# Patient Record
Sex: Male | Born: 1945 | Race: White | Hispanic: No | Marital: Married | Smoking: Never smoker
Health system: Southern US, Community
[De-identification: ages and names within clinical notes are randomized; demographics above are authoritative.]

## PROBLEM LIST (undated history)

## (undated) DIAGNOSIS — R030 Elevated blood-pressure reading, without diagnosis of hypertension: Secondary | ICD-10-CM

## (undated) DIAGNOSIS — I10 Essential (primary) hypertension: Secondary | ICD-10-CM

## (undated) DIAGNOSIS — E785 Hyperlipidemia, unspecified: Secondary | ICD-10-CM

## (undated) DIAGNOSIS — K209 Esophagitis, unspecified without bleeding: Secondary | ICD-10-CM

## (undated) DIAGNOSIS — C61 Malignant neoplasm of prostate: Secondary | ICD-10-CM

## (undated) DIAGNOSIS — K449 Diaphragmatic hernia without obstruction or gangrene: Secondary | ICD-10-CM

## (undated) DIAGNOSIS — K222 Esophageal obstruction: Secondary | ICD-10-CM

## (undated) DIAGNOSIS — K2270 Barrett's esophagus without dysplasia: Secondary | ICD-10-CM

## (undated) DIAGNOSIS — I447 Left bundle-branch block, unspecified: Secondary | ICD-10-CM

## (undated) DIAGNOSIS — D649 Anemia, unspecified: Secondary | ICD-10-CM

## (undated) DIAGNOSIS — K219 Gastro-esophageal reflux disease without esophagitis: Secondary | ICD-10-CM

## (undated) DIAGNOSIS — K2900 Acute gastritis without bleeding: Secondary | ICD-10-CM

## (undated) DIAGNOSIS — Z86010 Personal history of colonic polyps: Secondary | ICD-10-CM

## (undated) DIAGNOSIS — C189 Malignant neoplasm of colon, unspecified: Secondary | ICD-10-CM

## (undated) HISTORY — DX: Malignant neoplasm of colon, unspecified: C18.9

## (undated) HISTORY — DX: Left bundle-branch block, unspecified: I44.7

## (undated) HISTORY — DX: Esophagitis, unspecified: K20.9

## (undated) HISTORY — DX: Malignant neoplasm of prostate: C61

## (undated) HISTORY — DX: Elevated blood-pressure reading, without diagnosis of hypertension: R03.0

## (undated) HISTORY — DX: Esophageal obstruction: K22.2

## (undated) HISTORY — DX: Acute gastritis without bleeding: K29.00

## (undated) HISTORY — DX: Gastro-esophageal reflux disease without esophagitis: K21.9

## (undated) HISTORY — DX: Essential (primary) hypertension: I10

## (undated) HISTORY — DX: Hyperlipidemia, unspecified: E78.5

## (undated) HISTORY — DX: Barrett's esophagus without dysplasia: K22.70

## (undated) HISTORY — PX: ESOPHAGEAL DILATION: SHX303

## (undated) HISTORY — PX: OTHER SURGICAL HISTORY: SHX169

## (undated) HISTORY — DX: Anemia, unspecified: D64.9

## (undated) HISTORY — DX: Esophagitis, unspecified without bleeding: K20.90

## (undated) HISTORY — DX: Personal history of colonic polyps: Z86.010

## (undated) HISTORY — DX: Diaphragmatic hernia without obstruction or gangrene: K44.9

---

## 1998-08-17 ENCOUNTER — Encounter: Payer: Self-pay | Admitting: Emergency Medicine

## 1998-08-17 ENCOUNTER — Emergency Department (HOSPITAL_COMMUNITY): Admission: EM | Admit: 1998-08-17 | Discharge: 1998-08-17 | Payer: Self-pay | Admitting: Emergency Medicine

## 1998-09-02 ENCOUNTER — Ambulatory Visit (HOSPITAL_BASED_OUTPATIENT_CLINIC_OR_DEPARTMENT_OTHER): Admission: RE | Admit: 1998-09-02 | Discharge: 1998-09-02 | Payer: Self-pay | Admitting: Otolaryngology

## 1999-02-01 ENCOUNTER — Other Ambulatory Visit: Admission: RE | Admit: 1999-02-01 | Discharge: 1999-02-01 | Payer: Self-pay | Admitting: Gastroenterology

## 1999-02-01 ENCOUNTER — Encounter (INDEPENDENT_AMBULATORY_CARE_PROVIDER_SITE_OTHER): Payer: Self-pay | Admitting: Specialist

## 2000-03-13 ENCOUNTER — Ambulatory Visit (HOSPITAL_COMMUNITY): Admission: RE | Admit: 2000-03-13 | Discharge: 2000-03-13 | Payer: Self-pay | Admitting: *Deleted

## 2000-03-13 ENCOUNTER — Encounter: Payer: Self-pay | Admitting: *Deleted

## 2000-03-17 ENCOUNTER — Ambulatory Visit (HOSPITAL_COMMUNITY): Admission: RE | Admit: 2000-03-17 | Discharge: 2000-03-17 | Payer: Self-pay | Admitting: *Deleted

## 2000-03-17 ENCOUNTER — Encounter: Payer: Self-pay | Admitting: *Deleted

## 2000-12-25 ENCOUNTER — Encounter: Payer: Self-pay | Admitting: Family Medicine

## 2000-12-25 ENCOUNTER — Ambulatory Visit (HOSPITAL_COMMUNITY): Admission: RE | Admit: 2000-12-25 | Discharge: 2000-12-25 | Payer: Self-pay | Admitting: Family Medicine

## 2001-06-29 ENCOUNTER — Encounter: Payer: Self-pay | Admitting: Internal Medicine

## 2002-01-24 HISTORY — PX: CARDIAC CATHETERIZATION: SHX172

## 2002-03-11 ENCOUNTER — Ambulatory Visit (HOSPITAL_COMMUNITY): Admission: RE | Admit: 2002-03-11 | Discharge: 2002-03-11 | Payer: Self-pay | Admitting: Cardiology

## 2003-04-02 ENCOUNTER — Encounter: Payer: Self-pay | Admitting: Internal Medicine

## 2004-06-16 ENCOUNTER — Ambulatory Visit: Payer: Self-pay | Admitting: Internal Medicine

## 2004-07-14 ENCOUNTER — Ambulatory Visit: Payer: Self-pay | Admitting: Internal Medicine

## 2004-11-02 ENCOUNTER — Ambulatory Visit: Payer: Self-pay | Admitting: Gastroenterology

## 2004-12-06 ENCOUNTER — Encounter (INDEPENDENT_AMBULATORY_CARE_PROVIDER_SITE_OTHER): Payer: Self-pay | Admitting: Specialist

## 2004-12-06 ENCOUNTER — Ambulatory Visit: Payer: Self-pay | Admitting: Gastroenterology

## 2005-01-04 ENCOUNTER — Ambulatory Visit: Payer: Self-pay | Admitting: Gastroenterology

## 2005-01-24 HISTORY — PX: PROSTATECTOMY: SHX69

## 2005-02-11 ENCOUNTER — Ambulatory Visit: Payer: Self-pay | Admitting: Gastroenterology

## 2005-02-11 ENCOUNTER — Encounter (INDEPENDENT_AMBULATORY_CARE_PROVIDER_SITE_OTHER): Payer: Self-pay | Admitting: *Deleted

## 2005-08-31 ENCOUNTER — Ambulatory Visit: Payer: Self-pay | Admitting: Internal Medicine

## 2005-09-28 ENCOUNTER — Ambulatory Visit: Payer: Self-pay | Admitting: Internal Medicine

## 2006-01-24 DIAGNOSIS — C61 Malignant neoplasm of prostate: Secondary | ICD-10-CM

## 2006-01-24 HISTORY — DX: Malignant neoplasm of prostate: C61

## 2006-05-29 DIAGNOSIS — K219 Gastro-esophageal reflux disease without esophagitis: Secondary | ICD-10-CM

## 2006-05-29 DIAGNOSIS — I447 Left bundle-branch block, unspecified: Secondary | ICD-10-CM

## 2006-05-30 ENCOUNTER — Ambulatory Visit: Payer: Self-pay | Admitting: Internal Medicine

## 2006-05-30 LAB — CONVERTED CEMR LAB
ALT: 30 units/L (ref 0–40)
AST: 27 units/L (ref 0–37)
Cholesterol: 162 mg/dL (ref 0–200)
HDL: 42.2 mg/dL (ref >39.0)
Hgb A1c MFr Bld: 5.2 % (ref 4.6–6.0)
LDL Cholesterol: 100 mg/dL — ABNORMAL HIGH (ref 0–99)
PSA: 2.7 ng/mL (ref 0.10–4.00)
Total CHOL/HDL Ratio: 3.8
Triglycerides: 98 mg/dL (ref 0–149)
VLDL: 20 mg/dL (ref 0–40)

## 2006-10-17 ENCOUNTER — Telehealth: Payer: Self-pay | Admitting: Internal Medicine

## 2006-11-14 ENCOUNTER — Encounter: Payer: Self-pay | Admitting: Internal Medicine

## 2006-11-17 ENCOUNTER — Ambulatory Visit: Payer: Self-pay | Admitting: Gastroenterology

## 2006-11-20 ENCOUNTER — Encounter: Payer: Self-pay | Admitting: Internal Medicine

## 2006-11-20 ENCOUNTER — Ambulatory Visit: Payer: Self-pay | Admitting: Gastroenterology

## 2006-11-20 ENCOUNTER — Encounter: Payer: Self-pay | Admitting: Gastroenterology

## 2006-11-29 ENCOUNTER — Encounter: Payer: Self-pay | Admitting: Internal Medicine

## 2006-12-12 ENCOUNTER — Encounter: Payer: Self-pay | Admitting: Internal Medicine

## 2007-01-09 ENCOUNTER — Encounter: Payer: Self-pay | Admitting: Internal Medicine

## 2007-01-25 HISTORY — PX: INGUINAL HERNIA REPAIR: SHX194

## 2007-03-22 DIAGNOSIS — K5730 Diverticulosis of large intestine without perforation or abscess without bleeding: Secondary | ICD-10-CM | POA: Insufficient documentation

## 2007-03-22 DIAGNOSIS — K449 Diaphragmatic hernia without obstruction or gangrene: Secondary | ICD-10-CM | POA: Insufficient documentation

## 2007-03-22 DIAGNOSIS — D131 Benign neoplasm of stomach: Secondary | ICD-10-CM

## 2007-03-22 DIAGNOSIS — E785 Hyperlipidemia, unspecified: Secondary | ICD-10-CM

## 2007-03-22 DIAGNOSIS — K222 Esophageal obstruction: Secondary | ICD-10-CM | POA: Insufficient documentation

## 2007-03-22 DIAGNOSIS — K2270 Barrett's esophagus without dysplasia: Secondary | ICD-10-CM

## 2007-09-14 ENCOUNTER — Telehealth: Payer: Self-pay | Admitting: Gastroenterology

## 2007-10-15 ENCOUNTER — Emergency Department (HOSPITAL_COMMUNITY): Admission: EM | Admit: 2007-10-15 | Discharge: 2007-10-15 | Payer: Self-pay | Admitting: Emergency Medicine

## 2007-11-09 ENCOUNTER — Ambulatory Visit: Payer: Self-pay | Admitting: Internal Medicine

## 2007-11-09 DIAGNOSIS — Z8546 Personal history of malignant neoplasm of prostate: Secondary | ICD-10-CM

## 2007-11-09 DIAGNOSIS — Z860100 Personal history of colon polyps, unspecified: Secondary | ICD-10-CM | POA: Insufficient documentation

## 2007-11-19 ENCOUNTER — Encounter (INDEPENDENT_AMBULATORY_CARE_PROVIDER_SITE_OTHER): Payer: Self-pay | Admitting: *Deleted

## 2007-11-20 ENCOUNTER — Ambulatory Visit: Payer: Self-pay | Admitting: Gastroenterology

## 2007-12-03 ENCOUNTER — Encounter: Payer: Self-pay | Admitting: Gastroenterology

## 2007-12-03 ENCOUNTER — Ambulatory Visit: Payer: Self-pay | Admitting: Gastroenterology

## 2007-12-04 ENCOUNTER — Encounter: Payer: Self-pay | Admitting: Gastroenterology

## 2008-03-20 ENCOUNTER — Encounter: Payer: Self-pay | Admitting: Internal Medicine

## 2008-06-02 ENCOUNTER — Ambulatory Visit: Payer: Self-pay | Admitting: Internal Medicine

## 2008-06-02 DIAGNOSIS — I10 Essential (primary) hypertension: Secondary | ICD-10-CM

## 2008-06-03 ENCOUNTER — Ambulatory Visit: Payer: Self-pay | Admitting: Gastroenterology

## 2008-06-04 ENCOUNTER — Telehealth (INDEPENDENT_AMBULATORY_CARE_PROVIDER_SITE_OTHER): Payer: Self-pay | Admitting: *Deleted

## 2008-06-04 ENCOUNTER — Encounter (INDEPENDENT_AMBULATORY_CARE_PROVIDER_SITE_OTHER): Payer: Self-pay | Admitting: *Deleted

## 2008-08-07 ENCOUNTER — Ambulatory Visit: Payer: Self-pay | Admitting: Internal Medicine

## 2008-08-15 ENCOUNTER — Encounter (INDEPENDENT_AMBULATORY_CARE_PROVIDER_SITE_OTHER): Payer: Self-pay | Admitting: *Deleted

## 2008-08-15 LAB — CONVERTED CEMR LAB
Bilirubin, Direct: 0 mg/dL (ref 0.0–0.3)
Cholesterol: 191 mg/dL (ref 0–200)
HDL: 38.7 mg/dL — ABNORMAL LOW (ref >39.00)
LDL Cholesterol: 127 mg/dL — ABNORMAL HIGH (ref 0–99)
Total Bilirubin: 1.4 mg/dL — ABNORMAL HIGH (ref 0.3–1.2)
Total Protein: 6.9 g/dL (ref 6.0–8.3)
VLDL: 25.6 mg/dL (ref 0.0–40.0)

## 2009-02-16 ENCOUNTER — Ambulatory Visit: Payer: Self-pay | Admitting: Internal Medicine

## 2009-02-18 LAB — CONVERTED CEMR LAB
AST: 20 units/L (ref 0–37)
Albumin: 4.5 g/dL (ref 3.5–5.2)
Alkaline Phosphatase: 47 units/L (ref 39–117)
Cholesterol: 182 mg/dL (ref 0–200)
HDL: 41.9 mg/dL (ref >39.00)
Total CHOL/HDL Ratio: 4
Total Protein: 7.1 g/dL (ref 6.0–8.3)
Triglycerides: 117 mg/dL (ref 0.0–149.0)

## 2009-03-17 ENCOUNTER — Encounter: Payer: Self-pay | Admitting: Internal Medicine

## 2009-03-25 ENCOUNTER — Encounter: Payer: Self-pay | Admitting: Internal Medicine

## 2009-04-08 ENCOUNTER — Encounter: Payer: Self-pay | Admitting: Internal Medicine

## 2009-06-16 ENCOUNTER — Ambulatory Visit: Payer: Self-pay | Admitting: Internal Medicine

## 2009-06-25 ENCOUNTER — Ambulatory Visit: Payer: Self-pay | Admitting: Internal Medicine

## 2009-07-02 LAB — CONVERTED CEMR LAB
Alkaline Phosphatase: 48 units/L (ref 39–117)
Basophils Absolute: 0 10*3/uL (ref 0.0–0.1)
Bilirubin, Direct: 0.1 mg/dL (ref 0.0–0.3)
CO2: 31 meq/L (ref 19–32)
Calcium: 9.6 mg/dL (ref 8.4–10.5)
Creatinine, Ser: 1.2 mg/dL (ref 0.4–1.5)
Eosinophils Absolute: 0.3 10*3/uL (ref 0.0–0.7)
GFR calc non Af Amer: 65.92 mL/min (ref >60)
HDL: 44.8 mg/dL (ref >39.00)
Hemoglobin: 14.8 g/dL (ref 13.0–17.0)
LDL Cholesterol: 112 mg/dL — ABNORMAL HIGH (ref 0–99)
Lymphocytes Relative: 22.7 % (ref 12.0–46.0)
MCHC: 34.3 g/dL (ref 30.0–36.0)
Monocytes Relative: 8.1 % (ref 3.0–12.0)
Neutro Abs: 5.4 10*3/uL (ref 1.4–7.7)
Neutrophils Relative %: 65.7 % (ref 43.0–77.0)
Platelets: 298 10*3/uL (ref 150.0–400.0)
RDW: 13.7 % (ref 11.5–14.6)
Sodium: 144 meq/L (ref 135–145)
TSH: 1.95 microintl units/mL (ref 0.35–5.50)
Total Bilirubin: 0.7 mg/dL (ref 0.3–1.2)
Total CHOL/HDL Ratio: 4
Total Protein: 6.7 g/dL (ref 6.0–8.3)
Triglycerides: 147 mg/dL (ref 0.0–149.0)
VLDL: 29.4 mg/dL (ref 0.0–40.0)

## 2009-07-03 LAB — CONVERTED CEMR LAB: Hgb A1c MFr Bld: 5.3 % (ref 4.6–6.5)

## 2009-10-13 ENCOUNTER — Encounter: Payer: Self-pay | Admitting: Internal Medicine

## 2009-11-10 ENCOUNTER — Encounter (INDEPENDENT_AMBULATORY_CARE_PROVIDER_SITE_OTHER): Payer: Self-pay | Admitting: *Deleted

## 2009-12-02 ENCOUNTER — Telehealth: Payer: Self-pay | Admitting: Gastroenterology

## 2009-12-24 ENCOUNTER — Encounter (INDEPENDENT_AMBULATORY_CARE_PROVIDER_SITE_OTHER): Payer: Self-pay | Admitting: *Deleted

## 2009-12-25 ENCOUNTER — Telehealth (INDEPENDENT_AMBULATORY_CARE_PROVIDER_SITE_OTHER): Payer: Self-pay | Admitting: *Deleted

## 2010-02-02 ENCOUNTER — Encounter (INDEPENDENT_AMBULATORY_CARE_PROVIDER_SITE_OTHER): Payer: Self-pay | Admitting: *Deleted

## 2010-02-03 ENCOUNTER — Ambulatory Visit
Admission: RE | Admit: 2010-02-03 | Discharge: 2010-02-03 | Payer: Self-pay | Source: Home / Self Care | Attending: Gastroenterology | Admitting: Gastroenterology

## 2010-02-17 ENCOUNTER — Ambulatory Visit
Admission: RE | Admit: 2010-02-17 | Discharge: 2010-02-17 | Payer: Self-pay | Source: Home / Self Care | Attending: Gastroenterology | Admitting: Gastroenterology

## 2010-02-17 ENCOUNTER — Other Ambulatory Visit: Payer: Self-pay | Admitting: Gastroenterology

## 2010-02-17 DIAGNOSIS — K2900 Acute gastritis without bleeding: Secondary | ICD-10-CM | POA: Insufficient documentation

## 2010-02-17 DIAGNOSIS — K209 Esophagitis, unspecified: Secondary | ICD-10-CM

## 2010-02-17 DIAGNOSIS — D126 Benign neoplasm of colon, unspecified: Secondary | ICD-10-CM

## 2010-02-18 LAB — HELICOBACTER PYLORI SCREEN-BIOPSY: UREASE: NEGATIVE

## 2010-02-19 ENCOUNTER — Encounter: Payer: Self-pay | Admitting: Gastroenterology

## 2010-02-21 LAB — CONVERTED CEMR LAB
ALT: 25 units/L (ref 0–53)
AST: 33 units/L (ref 0–37)
Albumin: 4.5 g/dL (ref 3.5–5.2)
Alkaline Phosphatase: 55 units/L (ref 39–117)
Alkaline Phosphatase: 57 units/L (ref 39–117)
BUN: 19 mg/dL (ref 6–23)
Basophils Relative: 0.8 % (ref 0.0–3.0)
Bilirubin Urine: NEGATIVE
Bilirubin, Direct: 0 mg/dL (ref 0.0–0.3)
Calcium: 9.3 mg/dL (ref 8.4–10.5)
Chloride: 103 meq/L (ref 96–112)
Chloride: 108 meq/L (ref 96–112)
Cholesterol: 219 mg/dL — ABNORMAL HIGH (ref 0–200)
Creatinine, Ser: 1 mg/dL (ref 0.4–1.5)
Eosinophils Absolute: 0.3 10*3/uL (ref 0.0–0.7)
Eosinophils Relative: 3.2 % (ref 0.0–5.0)
Eosinophils Relative: 4.1 % (ref 0.0–5.0)
GFR calc non Af Amer: 73 mL/min
GFR calc non Af Amer: 80.86 mL/min (ref >60)
Glucose, Bld: 85 mg/dL (ref 70–99)
HDL: 34.8 mg/dL — ABNORMAL LOW (ref >39.0)
HDL: 36.9 mg/dL — ABNORMAL LOW (ref >39.00)
Lymphocytes Relative: 24.5 % (ref 12.0–46.0)
Lymphocytes Relative: 26.9 % (ref 12.0–46.0)
Monocytes Relative: 8 % (ref 3.0–12.0)
Monocytes Relative: 9.5 % (ref 3.0–12.0)
Neutrophils Relative %: 60.9 % (ref 43.0–77.0)
Neutrophils Relative %: 62 % (ref 43.0–77.0)
Platelets: 235 10*3/uL (ref 150–400)
Potassium: 4.5 meq/L (ref 3.5–5.1)
Protein, U semiquant: NEGATIVE
RBC: 4.72 M/uL (ref 4.22–5.81)
Total Bilirubin: 1 mg/dL (ref 0.3–1.2)
Total CHOL/HDL Ratio: 5.8
Total CHOL/HDL Ratio: 6
Total Protein: 6.9 g/dL (ref 6.0–8.3)
Triglycerides: 118 mg/dL (ref 0.0–149.0)
Triglycerides: 186 mg/dL — ABNORMAL HIGH (ref 0–149)
Urobilinogen, UA: 0.2
VLDL: 23.6 mg/dL (ref 0.0–40.0)
WBC: 5.9 10*3/uL (ref 4.5–10.5)
WBC: 7.2 10*3/uL (ref 4.5–10.5)

## 2010-02-22 DIAGNOSIS — Z86010 Personal history of colonic polyps: Secondary | ICD-10-CM

## 2010-02-22 HISTORY — DX: Personal history of colonic polyps: Z86.010

## 2010-02-23 ENCOUNTER — Encounter: Payer: Self-pay | Admitting: Gastroenterology

## 2010-02-25 NOTE — Miscellaneous (Signed)
Summary: [facility]  Clinical Lists Changes  Medications: Added new medication of MOVIPREP 100 GM  SOLR (PEG-KCL-NACL-NASULF-NA ASC-C) As per prep instructions. - Signed Rx of MOVIPREP 100 GM  SOLR (PEG-KCL-NACL-NASULF-NA ASC-C) As per prep instructions.;  #1 x 0;  Signed;  Entered by: [name] RN;  Authorized by: Mardella Layman MD [facility];  Method used: Electronically to [facility] [address] # [id number]*, [address] [place], [place], [place]  [address], Ph: [phone] or [phone], Fax: [phone] Observations: Added new observation of NKA: T (02/03/2010 7:58)    Prescriptions: MOVIPREP 100 GM  SOLR (PEG-KCL-NACL-NASULF-NA ASC-C) As per prep instructions.  #1 x 0   Entered by:   [name] RN   Authorized by:   Mardella Layman MD [facility]   Signed by:   [name] RN on 02/03/2010   Method used:   Electronically to        [facility] [address] [id number]* (retail)       [address]       [place], [place]  [address]       Ph: [phone] or [phone]       Fax: [phone]   RxID:   [id number]

## 2010-02-25 NOTE — Letter (Signed)
Summary: Pre Visit Letter Revised  [facility]  [address] [place], [place] [address]   Phone: [phone]  Fax: [phone]        12/24/2009 MRN: [id number] Steven Hawkins [address] [place], [place]  [address]             Procedure Date:  02/17/2010   Welcome to the Gastroenterology Division at [facility].    You are scheduled to see a nurse for your pre-procedure visit on 02/03/10 at 8:00 AM on the 3rd floor at [facility], [address]  We ask that you try to arrive at our office 15 minutes prior to your appointment time to allow for check-in.  Please take a minute to review the attached form.  If you answer "Yes" to one or more of the questions on the first page, we ask that you call the person listed at your earliest opportunity.  If you answer "No" to all of the questions, please complete the rest of the form and bring it to your appointment.    Your nurse visit will consist of discussing your medical and surgical history, your immediate family medical history, and your medications.   If you are unable to list all of your medications on the form, please bring the medication bottles to your appointment and we will list them.  We will need to be aware of both prescribed and over the counter drugs.  We will need to know exact dosage information as well.    Please be prepared to read and sign documents such as consent forms, a financial agreement, and acknowledgement forms.  If necessary, and with your consent, a friend or relative is welcome to sit-in on the nurse visit with you.  Please bring your insurance card so that we may make a copy of it.  If your insurance requires a referral to see a specialist, please bring your referral form from your primary care physician.  No co-pay is required for this nurse visit.     If you cannot keep your appointment, please call [phone] to cancel or reschedule prior to your appointment date.  This allows  [place] the opportunity to schedule an appointment for another patient in need of care.    Thank you for choosing [facility] for your medical needs.  We appreciate the opportunity to care for you.  Please visit [place] at our website  to learn more about our practice.  Sincerely, The Gastroenterology Division

## 2010-02-25 NOTE — Progress Notes (Signed)
  Phone Note Other Incoming   Request: Send information Summary of Call: Received records request from [facility] Copy Service, request forwarded to [employer].

## 2010-02-25 NOTE — Letter (Signed)
Summary: [facility]    Imported By: Lanelle Bal 04/13/2009 12:16:54  _____________________________________________________________________  External Attachment:    Type:   Image     Comment:   External Document

## 2010-02-25 NOTE — Assessment & Plan Note (Signed)
Summary: cpx//lch   Vital Signs:  Patient profile:   65 year old male Height:      70.75 inches Weight:      172 pounds BMI:     24.25 Temp:     98.6 degrees F oral Pulse rate:   71 / minute Resp:     14 per minute BP sitting:   128 / 86  (left arm) Cuff size:   large  Vitals Entered By: Shonna Chock (Jun 16, 2009 11:01 AM)  CC: Lipid Management Comments REVIEWED MED LIST, PATIENT AGREED DOSE AND INSTRUCTION CORRECT    Primary Care Provider:  Marga Melnick, MD  CC:  Lipid Management.  History of Present Illness: [name] is here for a physical; he is asymptomatic. He has lost 10# with sugar restriction.  Lipid Management History:      Positive NCEP/ATP III risk factors include male age 63 years old or older.  Negative NCEP/ATP III risk factors include non-diabetic, no family history for ischemic heart disease, non-tobacco-user status, non-hypertensive, no ASHD (atherosclerotic heart disease), no prior stroke/TIA, no peripheral vascular disease, and no history of aortic aneurysm.     Allergies (verified): No Known Drug Allergies  Past History:  Past Medical History: GERD, HH,gastric polyps, Dr Sheryn Bison 2007; Endoscopy  for Barrett's survelliance every 24 months Prostate cancer, hx of adenocarcinoma  Dr Shelby Dubin 2008 Colonic polyps, hx of,  Diverticulosis, colon Hyperlipidemia:NMR Lipoprofile 2005: LDL 144(1445/464),HDL 96,TG 108. LDL goal =< 130. [place] LDL goal= < 160. LBBB ,new  2004; ED (607.84); [facility]  Past Surgical History: Catheterization  for DOE & new LBBB  2004:negative;  Endoscopy  2006: HH,stricture,erosive esophagitis & 2007: gastric polyp ;  Prostatectomy 2008 , Shelby Dubin MD,[place] Inguinal herniorrhaphy, 2009 Colon polypectomy 2006  Family History: Father: IDDM,MI in 70s,prostate cancer ,CBAG Mother: MI in 70s,elevated homocysteine,Alsheimer's Siblings: negative ;MGM: lymphoma; nephew: NHL; PGM :HH  Social  History: Occupation: Charity fundraiser Married Never Smoked Alcohol use-yes: socially Regular exercise-yes:45 min with CVE & weights  Review of Systems  The patient denies anorexia, fever, vision loss, decreased hearing, hoarseness, chest pain, syncope, dyspnea on exertion, peripheral edema, prolonged cough, headaches, hemoptysis, abdominal pain, melena, hematochezia, severe indigestion/heartburn, hematuria, incontinence, suspicious skin lesions, unusual weight change, abnormal bleeding, enlarged lymph nodes, and angioedema.         Dysphagia with food approximately once weekly for a few seconds. Psych:  Denies anxiety, depression, easily angered, easily tearful, and irritability; Excess stress due to business.  Physical Exam  General:  Thin but well-nourished; alert,appropriate and cooperative throughout examination Head:  Normocephalic and atraumatic without obvious abnormalities. No apparent alopecia; moustache Eyes:  No corneal or conjunctival inflammation noted. No icterus.Perrla. Funduscopic exam benign, without hemorrhages, exudates or papilledema.  Ears:  External ear exam shows no significant lesions or deformities.  Otoscopic examination reveals clear canals, tympanic membranes are intact bilaterally without bulging, retraction, inflammation or discharge. Hearing is grossly normal bilaterally. Nose:  External nasal examination shows no deformity or inflammation. Nasal mucosa are pink and moist without lesions or exudates. Minimal dislocation & septal deviation. Mouth:  Oral mucosa and oropharynx without lesions or exudates.  Teeth in good repair. Neck:  No deformities, masses, or tenderness noted. Lungs:  Normal respiratory effort, chest expands symmetrically. Lungs are clear to auscultation, no crackles or wheezes. Heart:  Normal rate and regular rhythm. S1 and S2 normal without gallop, murmur, click, rub .S4 with slurring Abdomen:  Bowel sounds positive,abdomen soft  and  non-tender without masses, organomegaly or hernias noted. Genitalia:  Dr Margo Aye seen 02/2009; PSA 0.01 Msk:  No deformity or scoliosis noted of thoracic or lumbar spine.   Pulses:  R and L carotid,radial,dorsalis pedis and posterior tibial pulses are full and equal bilaterally Extremities:  No clubbing, cyanosis, edema. Minor DIP  deformity noted with normal full range of motion of all joints.   Neurologic:  alert & oriented X3 and DTRs symmetrical and normal.   Skin:  Intact without suspicious lesions or rashes Cervical Nodes:  No lymphadenopathy noted Axillary Nodes:  No palpable lymphadenopathy Psych:  memory intact for recent and remote, normally interactive, good eye contact, and not anxious appearing.     Impression & Recommendations:  Problem # 1:  ROUTINE GENERAL MEDICAL EXAM@HEALTH  CARE FACL (ICD-V70.0)  Orders: EKG w/ Interpretation (93[address])  Problem # 2:  HYPERLIPIDEMIA (ICD-272.4)  His updated medication list for this problem includes:    Pravastatin Sodium 20 Mg Tabs (Pravastatin sodium) .Marland Kitchen... 1 at bedtime  Orders: EKG w/ Interpretation (93[address])  Problem # 3:  BARRETTS ESOPHAGUS (ICD-530.85) PMH of; as per Dr Jarold Motto  Problem # 4:  PROSTATE CANCER, HX OF (ICD-V10.46) as per Dr Margo Aye  Problem # 5:  LEFT BUNDLE BRANCH BLOCK (ICD-426.3) present since 2004, negative catheterization 2004 Orders: EKG w/ Interpretation ([address])  Complete Medication List: 1)  Nexium 40 Mg Cpdr (Esomeprazole magnesium) .Marland Kitchen.. 1 by mouth once daily 2)  Piracetam Powd (Piracetam) .... 2400mg  daily 3)  Lecithin 1200 Mg Caps (Lecithin) .Marland Kitchen.. 1 by mouth once daily 4)  Turmeric Curcumin Caps (Misc natural products) .... 500mg  once daily 5)  Hesperidin 98 % Powd (Hesperidin) .... 500mg  once daily 6)  Quercitin Powd (Quercetin) .... 500mg  once daily 7)  Fish Oil 1200 Mg Caps (Omega-3 fatty acids) .Marland Kitchen.. 1 by mouth once daily 8)  Vitamin D3 400 Unit Tabs (Cholecalciferol) .Marland Kitchen.. 1 by mouth once  daily 9)  Vitamin E 400 Unit Caps (Vitamin e) .Marland Kitchen.. 1 by mouth once daily 10)  Coq10 200 Mg Caps (Coenzyme q10) .Marland Kitchen.. 1 by mouth once daily 11)  Selenium 100 Mcg Tabs (Selenium) .Marland Kitchen.. 1 by mouth once daily 12)  Vitamin C 1000 Mg Tabs (Ascorbic acid) .Marland Kitchen.. 1 by mouth once daily 13)  Centrum Tabs (Multiple vitamins-minerals) .Marland Kitchen.. 1 by mouth once daily 14)  Pravastatin Sodium 20 Mg Tabs (Pravastatin sodium) .Marland Kitchen.. 1 at bedtime  Lipid Assessment/Plan:      Based on NCEP/ATP III, the patient's risk factor category is "0-1 risk factors".  The patient's lipid goals are as follows: Total cholesterol goal is 200; LDL cholesterol goal is 130; HDL cholesterol goal is 40; Triglyceride goal is 150.  His LDL cholesterol goal has been met.    Patient Instructions: 1)  Your LDL goal = < 130. F/U with Dr Jarold Motto re: Barrett's Esophagus.Fasting labs in am: 2)  BMP ; 3)  Hepatic Panel ; 4)  Lipid Panel ; 5)  TSH ; 6)  CBC w/ Diff . Prescriptions: PRAVASTATIN SODIUM 20 MG TABS (PRAVASTATIN SODIUM) 1 at bedtime  #90 Tablet x 3   Entered and Authorized by:   Marga Melnick MD   Signed by:   Marga Melnick MD on 06/16/2009   Method used:   Print then Give to Patient   RxID:   [id number]

## 2010-02-25 NOTE — Procedures (Addendum)
Summary: Colonoscopy  Patient: Steven Hawkins Note: All result statuses are Final unless otherwise noted.  Tests: (1) Colonoscopy (COL)   COL Colonoscopy           DONE     [facility]     [address]     [place], [place]  [address]           COLONOSCOPY PROCEDURE REPORT           PATIENT:  [name], [name]  MR#:  [id number]     BIRTHDATE:  1945/10/23, 64 yrs. old  GENDER:  male     ENDOSCOPIST:  Vania Rea. Jarold Motto, MD, [facility]     REF. BY:  Marga Melnick, M.D.     PROCEDURE DATE:  02/17/2010     PROCEDURE:  Colonoscopy with snare polypectomy     ASA CLASS:  Class II     INDICATIONS:  history of polyps     MEDICATIONS:   Fentanyl 75 mcg IV, Versed 7 mg IV           DESCRIPTION OF PROCEDURE:   After the risks benefits and     alternatives of the procedure were thoroughly explained, informed     consent was obtained.  Digital rectal exam was performed and     revealed no abnormalities.   The LB [id number] endoscope     was introduced through the anus and advanced to the cecum, which     was identified by both the appendix and ileocecal valve, without     limitations.  The quality of the prep was excellent, using     MoviPrep.  The instrument was then slowly withdrawn as the colon     was fully examined.     <<PROCEDUREIMAGES>>           FINDINGS:  Mild diverticulosis was found in the sigmoid to     descending colon segments.  A sessile polyp was found in the     rectum. 5 mm sessile rectal polyp cold snare excised.  This was     otherwise a normal examination of the colon.   Retroflexed views     in the rectum revealed no abnormalities.    The scope was then     withdrawn from the patient and the procedure completed.           COMPLICATIONS:  None     ENDOSCOPIC IMPRESSION:     1) Mild diverticulosis in the sigmoid to descending colon     segments     2) Sessile polyp in the rectum     3) Otherwise normal examination     R/O ADENOMA.     RECOMMENDATIONS:  1) high fiber diet     2) Repeat colonoscopy in 5 years if polyp adenomatous; otherwise     10 years     REPEAT EXAM:  No           ______________________________     Vania Rea. Jarold Motto, MD, [facility]           CC:           n.     eSIGNED:   Vania Rea. Kycen Spalla at 02/17/2010 09:09 AM           Carter Kitten, [id number]  Note: An exclamation mark (!) indicates a result that was not dispersed into the flowsheet. Document Creation Date: 02/17/2010 9:09 AM _______________________________________________________________________  (1) Order result status: Final Collection or observation  date-time: 02/17/2010 08:55 Requested date-time:  Receipt date-time:  Reported date-time:  Referring Physician:   Ordering Physician: Sheryn Bison [phone]) Specimen Source:  Source: Launa Grill Order Number: [id number] Lab site:   Appended Document: Colonoscopy     Procedures Next Due Date:    Colonoscopy: 02/2020

## 2010-02-25 NOTE — Letter (Addendum)
Summary: Patient Notice-Endo Biopsy Results   Gastroenterology  [address] [place], [place] [address]   Phone: [phone]  Fax: [phone]        February 19, 2010 MRN: [id number]    Steven Hawkins [address] [place], [place]  [address]    Dear Mr. MCOMBER,  I am pleased to inform you that the biopsies taken during your recent endoscopic examination did not show any evidence of cancer upon pathologic examination.  Additional information/recommendations:  __No further action is needed at this time.  Please follow-up with      your primary care physician for your other healthcare needs.  __ Please call [phone] to schedule a return visit to review      your condition.  x__ Continue with the treatment plan as outlined on the day of your      exam.Biopsy for H. pylori was negative.  __ You should have a repeat endoscopic examination for this problem              in _ months/years.   Please call us if you are having persistent problems or have questions about your condition that have not been fully answered at this time.  Sincerely,  Mardella Layman MD [facility]  This letter has been electronically signed by your physician.  Appended Document: Patient Notice-Endo Biopsy Results LETTER MAILED

## 2010-02-25 NOTE — Letter (Signed)
Summary: Steven Hawkins Instructions  Steven Hawkins  [address] [place], [place] [address]   Phone: [phone]  Fax: [phone]       Steven Hawkins    05-01-1945    MRN: [id number]        Procedure Day Steven Hawkins:  [facility] 02/17/10     Arrival Time: 7:30am     Procedure Time: 8:30am     Location of Procedure:                    _X _  [facility] (4th Floor)                        PREPARATION FOR COLONOSCOPY WITH MOVIPREP   Starting 5 days prior to your procedure FRIDAY 01/20  do not eat nuts, seeds, popcorn, corn, beans, peas,  salads, or any raw vegetables.  Do not take any fiber supplements (e.g. Metamucil, Citrucel, and Benefiber).  THE DAY BEFORE YOUR PROCEDURE         DATE: TUESDAY  01/24  1.  Drink clear liquids the entire day-NO SOLID FOOD  2.  Do not drink anything colored red or purple.  Avoid juices with pulp.  No orange juice.  3.  Drink at least 64 oz. (8 glasses) of fluid/clear liquids during the day to prevent dehydration and help the prep work efficiently.  CLEAR LIQUIDS INCLUDE: Water Jello Ice Popsicles Tea (sugar ok, no milk/cream) Powdered fruit flavored drinks Coffee (sugar ok, no milk/cream) Gatorade Juice: apple, white grape, white cranberry  Lemonade Clear bullion, consomm, broth Carbonated beverages (any kind) Strained chicken noodle soup Hard Candy                             4.  In the morning, mix first dose of MoviPrep solution:    Empty 1 Pouch A and 1 Pouch B into the disposable container    Add lukewarm drinking water to the top line of the container. Mix to dissolve    Refrigerate (mixed solution should be used within 24 hrs)  5.  Begin drinking the prep at 5:00 p.m. The MoviPrep container is divided by 4 marks.   Every 15 minutes drink the solution down to the next mark (approximately 8 oz) until the full liter is complete.   6.  Follow completed prep with 16 oz of clear liquid of your choice (Nothing  red or purple).  Continue to drink clear liquids until bedtime.  7.  Before going to bed, mix second dose of MoviPrep solution:    Empty 1 Pouch A and 1 Pouch B into the disposable container    Add lukewarm drinking water to the top line of the container. Mix to dissolve    Refrigerate  THE DAY OF YOUR PROCEDURE      DATE: [facility]  01/25  Beginning at 3:30 a.m. (5 hours before procedure):         1. Every 15 minutes, drink the solution down to the next mark (approx 8 oz) until the full liter is complete.  2. Follow completed prep with 16 oz. of clear liquid of your choice.    3. You may drink clear liquids until 6:30am  (2 HOURS BEFORE PROCEDURE).   MEDICATION INSTRUCTIONS  Unless otherwise instructed, you should take regular prescription medications with a small sip of water   as early as possible the morning of  your procedure.           OTHER INSTRUCTIONS  You will need a responsible adult at least 63 years of age to accompany you and drive you home.   This person must remain in the waiting room during your procedure.  Wear loose fitting clothing that is easily removed.  Leave jewelry and other valuables at home.  However, you may wish to bring a book to read or  an iPod/MP3 player to listen to music as you wait for your procedure to start.  Remove all body piercing jewelry and leave at home.  Total time from sign-in until discharge is approximately 2-3 hours.  You should go home directly after your procedure and rest.  You can resume normal activities the  day after your procedure.  The day of your procedure you should not:   Drive   Make legal decisions   Operate machinery   Drink alcohol   Return to work  You will receive specific instructions about eating, activities and medications before you leave.    The above instructions have been reviewed and explained to me by   Steven Sites RN  February 03, 2010 8:22 AM     I fully understand and can  verbalize these instructions _____________________________ Date _________

## 2010-02-25 NOTE — Letter (Signed)
Summary: [facility]  [facility]   Imported By: Lanelle Bal 03/23/2009 08:27:41  _____________________________________________________________________  External Attachment:    Type:   Image     Comment:   External Document

## 2010-02-25 NOTE — Miscellaneous (Signed)
Summary: clo test  Clinical Lists Changes  Problems: Added new problem of GASTRITIS, ACUTE W/O HEMORRHAGE (ICD-535.00) Orders: Added new Test order of TLB-H Pylori Screen Gastric Biopsy (83013-CLOTEST) - Signed 

## 2010-02-25 NOTE — Procedures (Addendum)
Summary: Upper Endoscopy  Patient: [name] Note: All result statuses are Final unless otherwise noted.  Tests: (1) Upper Endoscopy (EGD)   EGD Upper Endoscopy       DONE     [facility]     [address]     [place], [place]  [address]           ENDOSCOPY PROCEDURE REPORT           PATIENT:  Steven Hawkins, Steven Hawkins  MR#:  [id number]     BIRTHDATE:  Jan 15, 1946, 64 yrs. old  GENDER:  male           ENDOSCOPIST:  Vania Rea. Jarold Motto, MD, [facility]     Referred by:  Marga Melnick, M.D.           PROCEDURE DATE:  02/17/2010     PROCEDURE:  EGD with biopsy for H. pylori 19147     ASA CLASS:  Class II     INDICATIONS:  GERD, h/o Barrett's Esophagus           MEDICATIONS:   There was residual sedation effect present from     prior procedure., Fentanyl 25 mcg IV, Versed 3 mg IV     TOPICAL ANESTHETIC:  Exactacain Spray           DESCRIPTION OF PROCEDURE:   After the risks benefits and     alternatives of the procedure were thoroughly explained, informed     consent was obtained.  The [facility] [id number] endoscope was     introduced through the mouth and advanced to the second portion of     the duodenum, without limitations.  The instrument was slowly     withdrawn as the mucosa was fully examined.     <<PROCEDUREIMAGES>>           Mild gastritis was found in the antrum. CLO BX. DONE  There were     multiple polyps identified. in the fundus. HYPERPLASTIC-BENIGN     FUNDAL POLYPS NOTED.SEE PICTURES.  The esophagus and     gastroesophageal junction were completely normal in appearance.     MULTIPLE BX. OF GE JUNCTION DONE.??? BARRETT'S MUCOSA.  The     duodenal bulb was normal in appearance, as was the postbulbar     duodenum.    Retroflexed views revealed Normal retroflexion.     The scope was then withdrawn from the patient and the procedure     completed.     COMPLICATIONS:  None           ENDOSCOPIC IMPRESSION:     1) Mild gastritis in the antrum     2) Polyps, multiple in  the fundus     3) Normal esophagus     4) Normal duodenum     CHRONIC GERD WELL CONTROLLED ON DAILY PPI RX.R/O BARRETT'S     MUCOSA.     RECOMMENDATIONS:     1) Await pathology results     2) continue PPI           REPEAT EXAM:  No           ______________________________     Vania Rea. Jarold Motto, MD, [facility]           CC:           n.     eSIGNED:   Vania Rea. Emrik Erhard at 02/17/2010 09:16 AM           Carter Kitten,  [id number]  Note: An exclamation mark (!) indicates a result that was not dispersed into the flowsheet. Document Creation Date: 02/17/2010 9:17 AM _______________________________________________________________________  (1) Order result status: Final Collection or observation date-time: 02/17/2010 09:07 Requested date-time:  Receipt date-time:  Reported date-time:  Referring Physician:   Ordering Physician: Sheryn Bison [phone]) Specimen Source:  Source: Launa Grill Order Number: [id number] Lab site:

## 2010-02-25 NOTE — Letter (Signed)
Summary: [facility]   Imported By: Lanelle Bal 04/15/2009 11:05:36  _____________________________________________________________________  External Attachment:    Type:   Image     Comment:   External Document

## 2010-02-25 NOTE — Progress Notes (Signed)
Summary: EGD/ECL?   Phone Note Call from Patient Call back at [phone]   Caller: Patient Call For: Dr. Jarold Motto Reason for Call: Talk to Nurse Summary of Call: pt is due for rec COL per IDX, but rec EGD is not indicated in IDX or EMR report... pt thinks he is supposed to have a rec ECL... pt also states that Dr. Alwyn Ren told pt that he needs to have another EGD Initial call taken by: Vallarie Mare,  December 02, 2009 4:15 PM  Follow-up for Phone Call        Dr Jarold Motto per your office note dated 06/03/08 patient needs q 2 year EGD for Hx of Barrett's lst EGD 12/03/07.  We just sent him colon recall letter ok to schedule for double? Follow-up by: Darcey Nora RN, CGRN,  December 02, 2009 4:25 PM  Additional Follow-up for Phone Call Additional follow up Details #1::        Patient  wants to schedule in January for the second week.  He states he will call back to schedule a double, as the current schedule is only available through 01/26/10 Additional Follow-up by: Darcey Nora RN, CGRN,  December 03, 2009 9:54 AM    Additional Follow-up for Phone Call Additional follow up Details #2::    yes...double needed....thanks Follow-up by: Mardella Layman MD [facility],  December 03, 2009 9:28 AM

## 2010-02-25 NOTE — Letter (Signed)
Summary: Colonoscopy Letter   Gastroenterology  [address] [place], [place] [address]   Phone: [phone]  Fax: [phone]      November 10, 2009 MRN: [id number]   Steven Hawkins [address] [place], [place]  [address]   Dear [name],   According to your medical record, it is time for you to schedule a Colonoscopy. The American Cancer Society recommends this procedure as a method to detect early colon cancer. Patients with a family history of colon cancer, or a personal history of colon polyps or inflammatory bowel disease are at increased risk.  This letter has been generated based on the recommendations made at the time of your procedure. If you feel that in your particular situation this may no longer apply, please contact our office.  Please call our office at [phone] to schedule this appointment or to update your records at your earliest convenience.  Thank you for cooperating with [place] to provide you with the very best care possible.   Sincerely,   Vania Rea. Jarold Motto, M.D.  [facility] Gastroenterology Division [phone]

## 2010-02-25 NOTE — Miscellaneous (Signed)
Summary: Flu/[facility]  Flu/[facility]   Imported By: Lanelle Bal 10/22/2009 11:52:31  _____________________________________________________________________  External Attachment:    Type:   Image     Comment:   External Document

## 2010-03-03 NOTE — Letter (Signed)
Summary: Patient Notice- Polyp Results  [facility]  [address] [place], [place] [address]   Phone: [phone]  Fax: [phone]        February 23, 2010 MRN: [id number]    Steven Hawkins [address] [place], [place]  [address]    Dear Mr. MAYA,  I am pleased to inform you that the colon polyp(s) removed during your recent colonoscopy was (were) found to be benign (no cancer detected) upon pathologic examination.  I recommend you have a repeat colonoscopy examination in 10_ years to look for recurrent polyps, as having colon polyps increases your risk for having recurrent polyps or even colon cancer in the future.  Should you develop new or worsening symptoms of abdominal pain, bowel habit changes or bleeding from the rectum or bowels, please schedule an evaluation with either your primary care physician or with me.  Additional information/recommendations:  _xx_ No further action with gastroenterology is needed at this time. Please      follow-up with your primary care physician for your other healthcare      needs.  __ Please call [phone] to schedule a return visit to review your      situation.  __ Please keep your follow-up visit as already scheduled.  __ Continue treatment plan as outlined the day of your exam.  Please call us if you are having persistent problems or have questions about your condition that have not been fully answered at this time.  Sincerely,  Mardella Layman MD [facility]  This letter has been electronically signed by your physician.  Appended Document: Patient Notice- Polyp Results letter mailed

## 2010-06-08 NOTE — Assessment & Plan Note (Signed)
Steven Hawkins                         GASTROENTEROLOGY OFFICE NOTE   Steven Hawkins, Steven Hawkins                       MRN:          [id number]  DATE:11/17/2006                            DOB:          July 04, 1945    Steven Hawkins has occasional breakthrough reflux symptoms for which he takes  antacids.  He is taking Nexium daily, and otherwise is doing well and  denies dysphagia or any lower bowel problems.  He has had colonoscopy  with removal of polyps.  He is due for followup next year.  His last  endoscopy was in January 2007, and really did not show any evidence of  Barrett's mucosa although 2 years previously he had some low grade  dysplasia.  It certainly seems that he has a short segment of Barrett's  mucosa.  He otherwise is doing well and denies any general medical  problems except for recent prostate biopsy per Dr. Marni Hawkins in [facility].  This was done because of a rise in PSA level.   Steven Hawkins is on multiple medications again listed and reviewed in his chart.   He has no drug allergies.   PHYSICAL EXAMINATION:  GENERAL:  Shows him to be a healthy-appearing  white male in no distress, appearing his stated age.  VITAL SIGNS:  He weighs 184 pounds.  Blood pressure 126/72, pulse was 80  and regular.  CHEST:  Clear.  HEART:  He was in a regular rhythm without murmurs, gallops, or rubs.  ABDOMEN:  Entirely normal.  EXTREMITIES:  Unremarkable.  MENTAL STATUS:  Clear.   ASSESSMENT:  1. Chronic gastroesophageal reflux disease with short segment of      Barrett's mucosa with time due for followup exam.  2. Recurrent colonic polyposis.  3. History of hyperlipidemia.  4. History of possible prostate cancer.   RECOMMENDATIONS:  1. Continue reflux regimen and daily PPI therapy.  2. Outpatient endoscopic exam and distal esophageal biopsies.  We will      use narrow band imaging to examine his esophagus at this time.  3. Continue other medications per his primary  care physician.    Steven Rea. Jarold Motto, MD, Caleen Essex, FAGA  Electronically Signed   DRP/MedQ  DD: 11/17/2006  DT: 11/17/2006  Job #: [id number]   cc:   Steven Hawkins. Steven Ren, MD,FACP,FCCP

## 2010-06-11 NOTE — Cardiovascular Report (Signed)
NAME:  Steven Hawkins, CAROTHERS NO.:  [id number]   MEDICAL RECORD NO.:  [id number]                   PATIENT TYPE:  OIB   LOCATION:  2899                                 FACILITY:  [facility]   PHYSICIAN:  Arturo Morton. Riley Kill, M.D. [facility]         DATE OF BIRTH:  Oct 08, 1945   DATE OF PROCEDURE:  03/11/2002  DATE OF DISCHARGE:                              CARDIAC CATHETERIZATION   PROCEDURES:  1. Left heart catheterization.  2. Selective coronary arteriography.  3. Selective left ventriculography.  4. Proximal root aortography.   INDICATIONS:  The patient is a 65 year old gentleman who recently has noted  some shortness of breath with activity.  He was noted to have a new left  bundle branch block.  Previous evaluation has been unremarkable.  He was  brought to the catheterization laboratory for further evaluation.  After  being seen by Dr. Daleen Squibb, left heart catheterization was ordered with coronary  arteriography.   DESCRIPTION OF PROCEDURE:  The procedure was performed from the right  femoral artery using 6 French catheters.  Ventriculography was performed in  the RAO projection.  Aortography was performed in the LAO projection.  Coronary arteriography was performed in multiple views using standard  Judkins catheters.  There were no complications, and he was taken to the  holding area in satisfactory clinical condition for hemostasis.   HEMODYNAMIC DATA:  1. Central aortic pressure was 128/74, mean of 97.  2. LV pressure 134/13.  3. No gradient on pullback across the aortic valve.   ANGIOGRAPHIC DATA:  1. On plain fluoroscopy there was perhaps minimal calcification in the     proximal left anterior descending artery.  2. The left ventriculography was performed in the RAO projection.  There was     very mild hang-up of the mid-anterolateral wall, which may be due to the     patient's left bundle branch block.  Overall systolic function was well-     preserved.   Ejection fraction was calculated at 56%.  There was no     significant mitral regurgitation noted.  3. The aortic root demonstrates normal leaflet motion.  There is no     significant aortic regurgitation.  The aorta does not appear to be     dilated, nor does there appear to be any evidence of aortic dissection.  4. The left main coronary artery was free of critical disease.  5. The LAD courses to the apex.  There is a diagonal branch that is small     and has perhaps mild proximal luminal irregularity noted in one or two     views.  Otherwise no significant focal stenosis is noted.  The LAD proper     appears to be widely patent and courses to the apex.  6. The circumflex provides a large proximal marginal branch that courses out     over the lateral wall and is free of  critical disease.  The AV circumflex     is also free of critical disease.  7. The right coronary artery is a dominant vessel.  It provides a posterior     descending and posterolateral system.  This all appears to be within     normal limits without significant focal stenosis.   CONCLUSIONS:  1. Preserved overall left ventricular function with mild dyssynchronous     motion of the mid-anterolateral wall and ejection fraction 56%.  2. No evidence of aortic dissection or significant aortic regurgitation.  3. No high-grade coronary abnormalities noted.   DISPOSITION:  The patient will follow up with Dr. Daleen Squibb.  The  echocardiographic studies reveal normal left atrial, right atrial, and right  ventricular size.  There was some suggestion about bowing of the interatrial  septum, and this will need to be reviewed with Pricilla Riffle, M.D. and  compared to previous echocardiograms.  Follow-up will be with Dr. Valera Castle.                                               Arturo Morton. Riley Kill, M.D. [facility]    TDS/MEDQ  D:  03/11/2002  T:  03/11/2002  Job:  [id number]   cc:   Jesse Sans. Wall, M.D. [facility]   Cardiovascular Laboratory

## 2010-07-01 ENCOUNTER — Other Ambulatory Visit: Payer: Self-pay | Admitting: Gastroenterology

## 2010-07-05 ENCOUNTER — Other Ambulatory Visit: Payer: Self-pay | Admitting: Gastroenterology

## 2010-07-05 MED ORDER — ESOMEPRAZOLE MAGNESIUM 40 MG PO CPDR: DELAYED_RELEASE_CAPSULE | ORAL | Status: DC

## 2010-07-05 NOTE — Telephone Encounter (Signed)
rx sent

## 2010-07-15 ENCOUNTER — Encounter: Payer: Self-pay | Admitting: Gastroenterology

## 2010-07-15 ENCOUNTER — Ambulatory Visit (INDEPENDENT_AMBULATORY_CARE_PROVIDER_SITE_OTHER): Payer: BC Managed Care – PPO | Admitting: Gastroenterology

## 2010-07-15 VITALS — BP 128/80 | HR 62 | Ht 70.0 in | Wt 171.0 lb

## 2010-07-15 DIAGNOSIS — K219 Gastro-esophageal reflux disease without esophagitis: Secondary | ICD-10-CM | POA: Insufficient documentation

## 2010-07-15 MED ORDER — ESOMEPRAZOLE MAGNESIUM 40 MG PO CPDR: DELAYED_RELEASE_CAPSULE | ORAL | Status: DC

## 2010-07-15 NOTE — Progress Notes (Signed)
History of Present Illness: This is a very pleasant 65 year old Caucasian male  Who has chronic acid reflux well managed  with daily Nexium 40 mg. He denies chest pain, dysphagia, anorexia, weight loss, hepatobiliary, or lower gastrointestinal problems. He is followed closely by Dr. Marga Melnick with yearly laboratory evaluations and examinations. The patient is here today for refill of his PPI medication which he's been on for several years without complications.    Current Medications, Allergies, Past Medical History, Past Surgical History, Family History and Social History were reviewed in [employer] record.   Assessment and plan: Uncontrolled acid reflux with standard antireflux maneuvers and daily PPI. Patient does not have evidence of Barrett's mucosa, and recent colonoscopy was negative except for hyperplastic polyps. He is to continue his current regime with followup yearly or when necessary as needed. No diagnosis found.  Please copy her primary care physician, referring physician, and pertinent subspecialists.

## 2010-07-15 NOTE — Patient Instructions (Signed)
Your prescription(s) have been sent to you pharmacy.  You will need an office visit in one year with Dr Jarold Motto

## 2010-09-13 ENCOUNTER — Encounter: Payer: Self-pay | Admitting: Internal Medicine

## 2010-09-13 ENCOUNTER — Ambulatory Visit (INDEPENDENT_AMBULATORY_CARE_PROVIDER_SITE_OTHER): Payer: BC Managed Care – PPO | Admitting: Internal Medicine

## 2010-09-13 VITALS — BP 110/80 | HR 86 | Ht 70.0 in | Wt 170.0 lb

## 2010-09-13 DIAGNOSIS — K2270 Barrett's esophagus without dysplasia: Secondary | ICD-10-CM

## 2010-09-13 DIAGNOSIS — Z860100 Personal history of colon polyps, unspecified: Secondary | ICD-10-CM

## 2010-09-13 DIAGNOSIS — K222 Esophageal obstruction: Secondary | ICD-10-CM

## 2010-09-13 DIAGNOSIS — E785 Hyperlipidemia, unspecified: Secondary | ICD-10-CM

## 2010-09-13 DIAGNOSIS — I447 Left bundle-branch block, unspecified: Secondary | ICD-10-CM

## 2010-09-13 DIAGNOSIS — D131 Benign neoplasm of stomach: Secondary | ICD-10-CM

## 2010-09-13 DIAGNOSIS — Z8546 Personal history of malignant neoplasm of prostate: Secondary | ICD-10-CM

## 2010-09-13 DIAGNOSIS — Z0000 Encounter for general adult medical examination without abnormal findings: Secondary | ICD-10-CM

## 2010-09-13 LAB — POCT URINALYSIS DIPSTICK
Bilirubin, UA: NEGATIVE
Blood, UA: NEGATIVE
Glucose, UA: NEGATIVE
Nitrite, UA: NEGATIVE
Spec Grav, UA: 1.015

## 2010-09-13 NOTE — Progress Notes (Signed)
Subjective:    Patient ID: Steven Hawkins, male    DOB: [birth year], 65 y.o.   MRN: [id number]  HPI  Steven Hawkins  is here for a physical; he has no acute issues.   Review of Systems Patient reports no  vision/ hearing changes,anorexia, weight change, fever ,adenopathy, persistant / recurrent hoarseness, swallowing issues, chest pain,palpitations, edema,persistant / recurrent cough, hemoptysis, dyspnea(rest, exertional, paroxysmal nocturnal), gastrointestinal  bleeding (melena, rectal bleeding), abdominal pain, excessive heart burn, GU symptoms( dysuria, hematuria, pyuria, voiding/incontinence  issues) syncope, focal weakness, memory loss,numbness & tingling, skin/hair/nail changes,depression, anxiety, or  abnormal bruising/bleeding . He has am stiffness in R hand  After excessive use day before    Objective:   Physical Exam Gen.: Healthy and well-nourished in appearance. Alert, appropriate and cooperative throughout exam. Head: Normocephalic without obvious abnormalities;  Moustache ; no  alopecia  Eyes: No corneal or conjunctival inflammation noted. Pupils equal round reactive to light and accommodation. Fundal exam is benign without hemorrhages, exudate, papilledema. Extraocular motion intact.  Ears: External  ear exam reveals no significant lesions or deformities. Canals clear .TMs normal. Hearing is grossly normal bilaterally. Nose: External nasal exam reveals no deformity or inflammation. Nasal mucosa are pink and moist. No lesions or exudates noted. Septum  :slight dislocation  Mouth: Oral mucosa and oropharynx reveal no lesions or exudates. Teeth in good repair. Neck: No deformities, masses, or tenderness noted. Range of motion &  Thyroid normal. Lungs: Normal respiratory effort; chest expands symmetrically. Lungs are clear to auscultation without rales, wheezes, or increased work of breathing. Heart: Normal rate and rhythm. Normal S1 and S2. No gallop, click, or rub. Grade 1/2 systolic  apical  murmur. Abdomen: Bowel sounds normal; abdomen soft and nontender. No masses, organomegaly or hernias noted. Genitalia: Large external hemorrhoid present. Varicele on L with granuloma L supratesticular area   .                                                                                   Musculoskeletal/extremities: No deformity or scoliosis noted of  the thoracic or lumbar spine. No clubbing, cyanosis, edema, or deformity noted. Range of motion  normal .Tone & strength  normal.Joints normal. Nail health  good. Vascular: Carotid, radial artery, dorsalis pedis and  posterior tibial pulses are full and equal. No bruits present. Neurologic: Alert and oriented x3. Deep tendon reflexes symmetrical and normal.         Skin: Intact without suspicious lesions or rashes. Lymph: No cervical, axillary, or inguinal lymphadenopathy present. Psych: Mood and affect are normal. Normally interactive                                                                                        Assessment & Plan:  #1 comprehensive physical exam; no acute findings #2 see Problem List with  Assessments & Recommendations Plan: see Orders

## 2010-09-13 NOTE — Patient Instructions (Signed)
Preventive Health Care: Exercise at least 30-45 minutes a day,  3-4 days a week.  Eat a low-fat diet with lots of fruits and vegetables, up to 7-9 servings per day. Consume less than 40 grams of sugar per day from foods & drinks with High Fructose Corn Sugar as #1,2,3 or # 4 on label. Health Care Power of Attorney & Living Will. Complete if not in place ; these place you in charge of your health care decisions. Please  schedule fasting Labs : BMET,Lipids, hepatic panel, CBC & dif, TSH (V70.0)

## 2010-09-16 ENCOUNTER — Other Ambulatory Visit: Payer: BC Managed Care – PPO

## 2010-09-17 ENCOUNTER — Other Ambulatory Visit: Payer: Self-pay | Admitting: Internal Medicine

## 2010-09-17 DIAGNOSIS — Z0000 Encounter for general adult medical examination without abnormal findings: Secondary | ICD-10-CM

## 2010-09-20 ENCOUNTER — Other Ambulatory Visit (INDEPENDENT_AMBULATORY_CARE_PROVIDER_SITE_OTHER): Payer: BC Managed Care – PPO

## 2010-09-20 DIAGNOSIS — Z136 Encounter for screening for cardiovascular disorders: Secondary | ICD-10-CM

## 2010-09-20 DIAGNOSIS — Z0000 Encounter for general adult medical examination without abnormal findings: Secondary | ICD-10-CM

## 2010-09-20 DIAGNOSIS — Z23 Encounter for immunization: Secondary | ICD-10-CM

## 2010-09-20 LAB — LIPID PANEL
Cholesterol: 191 mg/dL (ref 0–200)
HDL: 48.6 mg/dL (ref >39.00)
LDL Cholesterol: 123 mg/dL — ABNORMAL HIGH (ref 0–99)
VLDL: 19 mg/dL (ref 0.0–40.0)

## 2010-09-20 LAB — CBC WITH DIFFERENTIAL/PLATELET
Basophils Relative: 0.6 % (ref 0.0–3.0)
Eosinophils Absolute: 0.4 10*3/uL (ref 0.0–0.7)
Lymphocytes Relative: 21.5 % (ref 12.0–46.0)
MCHC: 33.8 g/dL (ref 30.0–36.0)
Monocytes Absolute: 0.6 10*3/uL (ref 0.1–1.0)
Neutrophils Relative %: 63.9 % (ref 43.0–77.0)
Platelets: 266 10*3/uL (ref 150.0–400.0)
RBC: 4.68 Mil/uL (ref 4.22–5.81)
WBC: 7.4 10*3/uL (ref 4.5–10.5)

## 2010-09-20 LAB — HEPATIC FUNCTION PANEL
ALT: 23 U/L (ref 0–53)
AST: 25 U/L (ref 0–37)
Bilirubin, Direct: 0.1 mg/dL (ref 0.0–0.3)
Total Protein: 6.7 g/dL (ref 6.0–8.3)

## 2010-09-20 LAB — BASIC METABOLIC PANEL
BUN: 21 mg/dL (ref 6–23)
Chloride: 105 mEq/L (ref 96–112)
GFR: 76.7 mL/min (ref >60.00)
Potassium: 3.7 mEq/L (ref 3.5–5.1)
Sodium: 141 mEq/L (ref 135–145)

## 2010-09-20 LAB — TSH: TSH: 1.6 u[IU]/mL (ref 0.35–5.50)

## 2010-09-20 NOTE — Progress Notes (Signed)
Labs only

## 2010-09-23 ENCOUNTER — Encounter: Payer: Self-pay | Admitting: Internal Medicine

## 2010-11-11 ENCOUNTER — Ambulatory Visit (INDEPENDENT_AMBULATORY_CARE_PROVIDER_SITE_OTHER): Payer: BC Managed Care – PPO | Admitting: *Deleted

## 2010-11-11 DIAGNOSIS — Z23 Encounter for immunization: Secondary | ICD-10-CM

## 2011-08-02 ENCOUNTER — Ambulatory Visit (INDEPENDENT_AMBULATORY_CARE_PROVIDER_SITE_OTHER): Payer: BC Managed Care – PPO | Admitting: Gastroenterology

## 2011-08-02 ENCOUNTER — Encounter: Payer: Self-pay | Admitting: Gastroenterology

## 2011-08-02 VITALS — BP 110/70 | HR 96 | Ht 70.0 in | Wt 178.4 lb

## 2011-08-02 DIAGNOSIS — K5730 Diverticulosis of large intestine without perforation or abscess without bleeding: Secondary | ICD-10-CM

## 2011-08-02 DIAGNOSIS — K591 Functional diarrhea: Secondary | ICD-10-CM

## 2011-08-02 DIAGNOSIS — K219 Gastro-esophageal reflux disease without esophagitis: Secondary | ICD-10-CM

## 2011-08-02 MED ORDER — ESOMEPRAZOLE MAGNESIUM 40 MG PO CPDR: DELAYED_RELEASE_CAPSULE | ORAL | Status: DC

## 2011-08-02 NOTE — Patient Instructions (Addendum)
We have sent the following medications to your pharmacy for you to pick up at your convenience: Nexium. Start Metamucil fiber supplement daily which is over the counter. cc: Marga Melnick, MD

## 2011-08-02 NOTE — Progress Notes (Signed)
This is a 66 year old Caucasian male who has chronic GERD doing well on Nexium 40 mg a day. He currently is asymptomatic and specifically denies chest pain or dysphagia. Endoscopy year ago showed no evidence of Barrett's mucosa. Also colonoscopy was unremarkable except for diverticulosis. He does have a strong family history of inflammatory bowel disease. Currently he describes several loose stools a day without heme or, pain. He has started taking magnesium tablets, and also a variety of multivitamins. He denies a specific food intolerances, anorexia, weight loss, systemic complaints, history of hepatitis or pancreatitis. He also has had no melena, hematochezia, fever or chills.  Current Medications, Allergies, Past Medical History, Past Surgical History, Family History and Social History were reviewed in [employer] record.  Pertinent Review of Systems Negative   Physical Exam: Healthy-appearing patient in no distress. Blood pressure 110/70, pulse 95 and regular, and weight 178 pounds with a BMI of 25.59. There are no stigmata of chronic liver disease. Chest is clear and he is in a regular rhythm without murmurs gallops or rubs. I cannot appreciate hepatosplenomegaly, abdominal masses, tenderness, or distention. Bowel sounds are normal. Peripheral extremities are unremarkable. Mental status is normal.    Assessment and Plan: Chronic GERD well-controlled on daily PPI therapy. He has some diarrhea perhaps related to use of nonabsorbable carbohydrates, and we have given a list of these substances to avoid including sorbitol and fructose. With his diverticulosis I have asked him to follow a high fiber diet with daily Metamucil. Also he probably should discontinue his magnesium supplementation if possible. He has laboratory parameters scheduled with primary care later this year. He is up-to-date on his colonoscopy and endoscopy exams. Please copy Dr. Marga Melnick. No diagnosis  found.

## 2011-08-03 ENCOUNTER — Encounter: Payer: Self-pay | Admitting: Gastroenterology

## 2011-09-19 ENCOUNTER — Encounter: Payer: Self-pay | Admitting: Internal Medicine

## 2011-09-19 ENCOUNTER — Ambulatory Visit (INDEPENDENT_AMBULATORY_CARE_PROVIDER_SITE_OTHER): Payer: BC Managed Care – PPO | Admitting: Internal Medicine

## 2011-09-19 VITALS — BP 142/80 | HR 65 | Temp 98.9°F | Resp 12 | Ht 70.08 in | Wt 172.4 lb

## 2011-09-19 DIAGNOSIS — R0602 Shortness of breath: Secondary | ICD-10-CM

## 2011-09-19 DIAGNOSIS — D649 Anemia, unspecified: Secondary | ICD-10-CM

## 2011-09-19 DIAGNOSIS — R0989 Other specified symptoms and signs involving the circulatory and respiratory systems: Secondary | ICD-10-CM

## 2011-09-19 DIAGNOSIS — Z0000 Encounter for general adult medical examination without abnormal findings: Secondary | ICD-10-CM

## 2011-09-19 LAB — LIPID PANEL
Cholesterol: 175 mg/dL (ref 0–200)
HDL: 48.5 mg/dL (ref >39.00)
Triglycerides: 66 mg/dL (ref 0.0–149.0)

## 2011-09-19 LAB — CBC WITH DIFFERENTIAL/PLATELET
Basophils Absolute: 0.1 10*3/uL (ref 0.0–0.1)
Basophils Relative: 1 % (ref 0.0–3.0)
Eosinophils Absolute: 0.2 10*3/uL (ref 0.0–0.7)
Hemoglobin: 9.4 g/dL — ABNORMAL LOW (ref 13.0–17.0)
MCHC: 30.8 g/dL (ref 30.0–36.0)
MCV: 65.9 fl — ABNORMAL LOW (ref 78.0–100.0)
Monocytes Absolute: 0.6 10*3/uL (ref 0.1–1.0)
Neutro Abs: 4.6 10*3/uL (ref 1.4–7.7)
RBC: 4.65 Mil/uL (ref 4.22–5.81)
RDW: 17.1 % — ABNORMAL HIGH (ref 11.5–14.6)

## 2011-09-19 LAB — BASIC METABOLIC PANEL
CO2: 28 mEq/L (ref 19–32)
Chloride: 106 mEq/L (ref 96–112)
Creatinine, Ser: 1.2 mg/dL (ref 0.4–1.5)
Glucose, Bld: 87 mg/dL (ref 70–99)
Sodium: 141 mEq/L (ref 135–145)

## 2011-09-19 LAB — HEPATIC FUNCTION PANEL
Albumin: 4.2 g/dL (ref 3.5–5.2)
Total Protein: 6.8 g/dL (ref 6.0–8.3)

## 2011-09-19 NOTE — Addendum Note (Signed)
Addended by: Marga Melnick F on: 09/19/2011 01:00 PM   Modules accepted: Level of Service

## 2011-09-19 NOTE — Progress Notes (Signed)
Subjective:    Patient ID: Steven Hawkins, male    DOB: [birth year], 66 y.o.   MRN: [id number]  HPI  Steven Hawkins is here for a physical;acute issues include DOE .      Review of Systems For approximately the last 2 months he has noted dyspnea after being on elliptical machine for approximately 4 minutes, a new phenomenon. He has no associated wheezing; he has no history of asthma. He's never smoked.  He has minor exertional chest pains which are unchanged. He has a history of normal catheterization in 2004.  He was not able to donate blood for months ago because of anemia. He's had no associated dysphagia, abdominal pain, unexplained weight loss, melena, rectal bleeding. He was seen by his gastroenterologist for  loose stools which responded to Metamucil. Colonoscopy and endoscopy were completed in January 2012. He does have a past history of hyperplastic polyp and Barrett's esophagus and stricture  He's also noted some dyspnea when he squats or bends at the waist doing yard work. He does have a history of hiatal hernia.     Objective:   Physical Exam Gen.: Thin but healthy and well-nourished in appearance. Alert, appropriate and cooperative throughout exam. Head: Normocephalic without obvious abnormalities;  moustache Eyes: No corneal or conjunctival inflammation noted. Pupils equal round reactive to light and accommodation. Fundal exam is benign without hemorrhages, exudate, papilledema. Extraocular motion intact. Vision grossly normal. Ears: External  ear exam reveals no significant lesions or deformities. Canals clear .TMs normal. Hearing is grossly normal bilaterally. Nose: External nasal exam reveals no deformity or inflammation. Nasal mucosa are pink and moist. No lesions or exudates noted.   Mouth: Oral mucosa and oropharynx reveal no lesions or exudates. Teeth in good repair. Neck: No deformities, masses, or tenderness noted. Range of motion & Thyroid normal. Lungs: Normal  respiratory effort; chest expands symmetrically. Lungs are clear to auscultation without rales, wheezes, or increased work of breathing. Heart: Normal rate and rhythm. Normal S1 and S2. No gallop, click, or rub. S4 w/o murmur. Abdomen: Bowel sounds normal; abdomen soft and nontender. No masses, organomegaly or hernias noted. Genitalia: Dr Tasia Catchings Musculoskeletal/extremities: No deformity or scoliosis noted of  the thoracic or lumbar spine. No clubbing, cyanosis, edema, or deformity noted. Range of motion  normal .Tone & strength  normal.Joints normal. Nail health  good. Vascular: Carotid, radial artery, dorsalis pedis and  posterior tibial pulses are full and equal. No bruits present. Neurologic: Alert and oriented x3. Deep tendon reflexes symmetrical and normal.          Skin: Intact without suspicious lesions or rashes. Lymph: No cervical, axillary lymphadenopathy present. Psych: Mood and affect are normal. Normally interactive                                                                                          Assessment & Plan:  #1 comprehensive physical exam; no acute findings #2 exertional dyspnea in the context of recent anemia. #3 left bundle branch block, unchanged. History of negative catheterization 2004. Plan: see Orders . If anemia is documented; GI evaluation will need to be completed. If there  is no anemia ;nuclear stress exercise tolerance test is indicated due to LBBB

## 2011-09-19 NOTE — Patient Instructions (Addendum)
Please complete & return stool cards. Please try to go on My Chart within the next 24 hours to allow me to release the results directly to you.

## 2011-09-20 ENCOUNTER — Other Ambulatory Visit: Payer: Self-pay | Admitting: Internal Medicine

## 2011-09-20 DIAGNOSIS — D649 Anemia, unspecified: Secondary | ICD-10-CM

## 2011-09-20 DIAGNOSIS — R0609 Other forms of dyspnea: Secondary | ICD-10-CM

## 2011-09-28 ENCOUNTER — Other Ambulatory Visit (INDEPENDENT_AMBULATORY_CARE_PROVIDER_SITE_OTHER): Payer: BC Managed Care – PPO

## 2011-09-28 DIAGNOSIS — Z1289 Encounter for screening for malignant neoplasm of other sites: Secondary | ICD-10-CM

## 2011-09-30 ENCOUNTER — Other Ambulatory Visit (INDEPENDENT_AMBULATORY_CARE_PROVIDER_SITE_OTHER): Payer: BC Managed Care – PPO

## 2011-09-30 ENCOUNTER — Encounter: Payer: Self-pay | Admitting: Gastroenterology

## 2011-09-30 ENCOUNTER — Ambulatory Visit (INDEPENDENT_AMBULATORY_CARE_PROVIDER_SITE_OTHER): Payer: BC Managed Care – PPO | Admitting: Gastroenterology

## 2011-09-30 VITALS — BP 140/80 | HR 62 | Ht 70.75 in | Wt 171.0 lb

## 2011-09-30 DIAGNOSIS — K219 Gastro-esophageal reflux disease without esophagitis: Secondary | ICD-10-CM

## 2011-09-30 DIAGNOSIS — D649 Anemia, unspecified: Secondary | ICD-10-CM

## 2011-09-30 DIAGNOSIS — K922 Gastrointestinal hemorrhage, unspecified: Secondary | ICD-10-CM

## 2011-09-30 DIAGNOSIS — R195 Other fecal abnormalities: Secondary | ICD-10-CM

## 2011-09-30 DIAGNOSIS — K2270 Barrett's esophagus without dysplasia: Secondary | ICD-10-CM

## 2011-09-30 LAB — CBC WITH DIFFERENTIAL/PLATELET
Basophils Absolute: 0.1 10*3/uL (ref 0.0–0.1)
Basophils Relative: 1.2 % (ref 0.0–3.0)
Hemoglobin: 9.3 g/dL — ABNORMAL LOW (ref 13.0–17.0)
Lymphocytes Relative: 21.6 % (ref 12.0–46.0)
Monocytes Relative: 10.5 % (ref 3.0–12.0)
Neutro Abs: 5.1 10*3/uL (ref 1.4–7.7)
Neutrophils Relative %: 62.8 % (ref 43.0–77.0)
RBC: 4.56 Mil/uL (ref 4.22–5.81)
RDW: 18.3 % — ABNORMAL HIGH (ref 11.5–14.6)

## 2011-09-30 LAB — IBC PANEL
Saturation Ratios: 2.9 % — ABNORMAL LOW (ref 20.0–50.0)
Transferrin: 373 mg/dL — ABNORMAL HIGH (ref 212.0–360.0)

## 2011-09-30 LAB — VITAMIN B12: Vitamin B-12: 513 pg/mL (ref 211–911)

## 2011-09-30 MED ORDER — HEMAX 150-1 MG PO TABS: 150.0000 mg | ORAL_TABLET | Freq: Every day | ORAL | Status: DC

## 2011-09-30 MED ORDER — PEG-KCL-NACL-NASULF-NA ASC-C 100 G PO SOLR: 1.0000 | Freq: Once | ORAL | Status: DC

## 2011-09-30 NOTE — Patient Instructions (Addendum)
You have been scheduled for an endoscopy and colonoscopy with propofol. Please follow the written instructions given to you at your visit today. Please pick up your prep at the pharmacy within the next 1-3 days. If you use inhalers (even only as needed), please bring them with you on the day of your procedure. Your physician has requested that you go to the basement for the following lab work before leaving today. We have given you samples of the following medication to take: Hemax take one pill every day. We have sent the following medications to your pharmacy for you to pick up at your convenience if you tolorate the samples.  CC: Marga Melnick, M. D.

## 2011-09-30 NOTE — Progress Notes (Signed)
This is a 66 year old Caucasian male with chronic GERD and Barrett's mucosa in his esophagus. He is been asymptomatic on daily PPI therapy. He is up-to-date on his endoscopy and colonoscopy, but over the last years had a marked drop in his hemoglobin from 13-9 without real melena or hematochezia or other GI complaints except for persistent regurgitation and burning chest pain despite twice a day Nexium. There is no history of dysphagia, bowel or regularity, lower nominal pain or any hepatobiliary complaints. He denies use of alcohol, cigarettes, or NSAIDs. He does voluntarily donate blood to the blood bank, but was recently refused because of his low hemoglobin. There is no history of severe cardiovascular or pulmonary complaints. The patient recently self started oral iron therapy..  Current Medications, Allergies, Past Medical History, Past Surgical History, Family History and Social History were reviewed in [employer] record.  Pertinent Review of Systems Negative... shortness of breath with exertion. He relates a recent cardiac evaluation by Dr. Alwyn Ren was unremarkable.   Physical Exam: Awake and alert no acute distress without stigmata of chronic liver disease. Blood pressure 140/80, pulse 62 and regular, and weight 171 pounds with BMI of 24.82. I cannot appreciate stigmata of chronic liver disease. Chest is clear and he is in a regular rhythm without murmurs gallops or rubs. I cannot appreciate hepatosplenomegaly, abdominal masses or tenderness. Bowel sounds are normal. Peripheral extremities are unremarkable, mental status is normal. Inspection the rectum is unremarkable as is rectal exam. There is normal color stool present which is trace guaiac positive.    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 Patient does have Barrett's mucosa, but has had no  endoscopic history of dysplasia. Previous colonoscopies  have not shown evidence of a lower GI source of bleeding. We will repeat his endoscopy and colonoscopy, and if these are unremarkable, we'll proceed with video pill camera exam of the small intestine. We have ordered repeat CBC and anemia profile, and placed him on stronger by mouth iron therapy.. Encounter Diagnoses  Name Primary?  Marland Kitchen Anemia Yes  . GI bleed

## 2011-10-03 ENCOUNTER — Encounter: Payer: Self-pay | Admitting: Gastroenterology

## 2011-10-07 ENCOUNTER — Other Ambulatory Visit: Payer: BC Managed Care – PPO

## 2011-10-07 ENCOUNTER — Telehealth: Payer: Self-pay | Admitting: *Deleted

## 2011-10-07 ENCOUNTER — Encounter: Payer: Self-pay | Admitting: Gastroenterology

## 2011-10-07 ENCOUNTER — Ambulatory Visit (AMBULATORY_SURGERY_CENTER): Payer: BC Managed Care – PPO | Admitting: Gastroenterology

## 2011-10-07 VITALS — BP 141/80 | HR 70 | Temp 98.1°F | Resp 28 | Ht 71.0 in | Wt 170.0 lb

## 2011-10-07 DIAGNOSIS — K6389 Other specified diseases of intestine: Secondary | ICD-10-CM

## 2011-10-07 DIAGNOSIS — R079 Chest pain, unspecified: Secondary | ICD-10-CM

## 2011-10-07 DIAGNOSIS — D126 Benign neoplasm of colon, unspecified: Secondary | ICD-10-CM

## 2011-10-07 DIAGNOSIS — K639 Disease of intestine, unspecified: Secondary | ICD-10-CM

## 2011-10-07 DIAGNOSIS — K2900 Acute gastritis without bleeding: Secondary | ICD-10-CM

## 2011-10-07 DIAGNOSIS — D649 Anemia, unspecified: Secondary | ICD-10-CM

## 2011-10-07 DIAGNOSIS — K922 Gastrointestinal hemorrhage, unspecified: Secondary | ICD-10-CM

## 2011-10-07 MED ORDER — SODIUM CHLORIDE 0.9 % IV SOLN: 500.0000 mL | INTRAVENOUS | Status: DC

## 2011-10-07 NOTE — Patient Instructions (Addendum)

## 2011-10-07 NOTE — Progress Notes (Signed)
Patient did not experience any of the following events: a burn prior to discharge; a fall within the facility; wrong site/side/patient/procedure/implant event; or a hospital transfer or hospital admission upon discharge from the facility. [phone]) Patient did not have preoperative order for IV antibiotic SSI prophylaxis. [phone])   Pt sent to lab before DC per D.O.

## 2011-10-07 NOTE — Op Note (Signed)
[facility] [address] [place], [address]   COLONOSCOPY PROCEDURE REPORT  PATIENT: Steven Hawkins, Steven Hawkins  MR#: [id number] BIRTHDATE: [birth year] , 65  yrs. old GENDER: Male ENDOSCOPIST: Mardella Layman, MD, [facility] REFERRED BY:  Marga Melnick, M.D. PROCEDURE DATE:  10/07/2011 PROCEDURE:   Colonoscopy with biopsy ASA CLASS:   Class II INDICATIONS:patient's personal history of colon polyps and iron deficiency anemia. MEDICATIONS: propofol (Diprivan) 350mg  IV  DESCRIPTION OF PROCEDURE:   After the risks and benefits and of the procedure were explained, informed consent was obtained.        The LB CF-[id number]  endoscope was introduced through the anus and advanced to the cecum, which was identified by both the appendix and ileocecal valve .  The quality of the prep was excellent, using MoviPrep .  The instrument was then slowly withdrawn as the colon was fully examined.     COLON FINDINGS: The colon was redundant.  Manual abdominal counter-pressure was used to reach the cecum.   A one-third circumferential ulcerated mass was found at the cecum.  Multiple biopsies of the area were performed using cold forceps.   Moderate diverticulosis was noted in the descending colon and sigmoid colon. Moderate sized internal hemorrhoids were found.     Retroflexed views revealed internal hemorrhoids.     The scope was then withdrawn from the patient and the procedure completed.  COMPLICATIONS: There were no complications. ENDOSCOPIC IMPRESSION: 1.   The colon was redundant 2.   One-third circumferential mass were found at the cecum; multiple biopsies of the area were performed using cold forceps 3.   Moderate diverticulosis was noted in the descending colon and sigmoid colon 4.   Moderate sized internal hemorrhoids  RECOMMENDATIONS: 1.  await pathology results 2.  My office will arrange for you to have a CT scan of abdomen and pelvis. 3.  My office will arrange for  you to meet with a surgeon.   REPEAT EXAM:  cc:  _______________________________ eSignedMardella Layman, MD, [facility] 10/07/2011 4:02 PM

## 2011-10-07 NOTE — Telephone Encounter (Signed)
Informed pt of his CT scan with date, location and prep instructions; he and wife stated understanding. I will call Monday with the Cardiology referral appt and he will call for a possible surgeon for consult. He will go to the lab today for his CEA.

## 2011-10-10 ENCOUNTER — Telehealth: Payer: Self-pay

## 2011-10-10 ENCOUNTER — Telehealth: Payer: Self-pay | Admitting: Gastroenterology

## 2011-10-10 NOTE — Telephone Encounter (Signed)
Left message on answering machine. 

## 2011-10-10 NOTE — Telephone Encounter (Signed)
I spoke with the patient today about his diagnosis of carcinoma of the cecum. We have scheduled CT scan tomorrow, surgical and cardiology referrals because of his chest pain associated with his anemia. CEA level was normal. 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

## 2011-10-10 NOTE — Telephone Encounter (Signed)
Patient is scheduled to see Dr. Romie Levee on 10/20/11 on 10/20/11 @ 3:05pm, arrive @ 2:35pm. If you have any questions please call [phone].   By the time I called the pt, he'd gotten an EMAIL from [facility] CHART with the appt with Dr Maisie Fus.  Dr Jarold Motto, pt wants to know when he will have an EGD because he is still having problems with heartburn or do you feel the problem is r/t the mass in his Cecum?  ( he says you instructed him to take Nexium BID, but his insurance won't pay for it so he takes a Prilosec in the pm and then he takes Gaviscon with very little relief ) Thanks.

## 2011-10-10 NOTE — Telephone Encounter (Signed)
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

## 2011-10-11 ENCOUNTER — Ambulatory Visit (INDEPENDENT_AMBULATORY_CARE_PROVIDER_SITE_OTHER)
Admission: RE | Admit: 2011-10-11 | Discharge: 2011-10-11 | Disposition: A | Discharge status: Home / Self Care | Payer: BC Managed Care – PPO | Source: Ambulatory Visit | Attending: Gastroenterology | Admitting: Gastroenterology

## 2011-10-11 DIAGNOSIS — K6389 Other specified diseases of intestine: Secondary | ICD-10-CM

## 2011-10-11 DIAGNOSIS — K639 Disease of intestine, unspecified: Secondary | ICD-10-CM

## 2011-10-11 MED ORDER — IOHEXOL 300 MG/ML  SOLN
100.0000 mL | Freq: Once | INTRAMUSCULAR | Status: AC | PRN
  Administered 2011-10-11: 100 mL via INTRAVENOUS

## 2011-10-11 NOTE — Telephone Encounter (Signed)
Informed pt of change of Cardiology md and appt. Pt stated understanding. He also stated he may want info faxed to Dr Patrick North at [facility] if he can get in.

## 2011-10-12 ENCOUNTER — Telehealth: Payer: Self-pay | Admitting: Gastroenterology

## 2011-10-12 NOTE — Telephone Encounter (Signed)
Pt decided to see Dr Donnetta Hutching as well as Dr Maisie Fus. If he decides, he may cancel the appt with Maisie Fus. Faxed  in order to [facility] in [place] and [place] in [place] to send info to Dr Donnetta Hutching, [facility], [place] [address] [place], [place] [address], [place] EX # [phone] He will go to [address], [place], [place] [address]  [phone]. He may park in the [employer] and walk across or use Valet parking. Pt stated understanding. appt 10/20/11 at 0930am.

## 2011-10-13 ENCOUNTER — Telehealth: Payer: Self-pay | Admitting: *Deleted

## 2011-10-13 ENCOUNTER — Encounter: Payer: Self-pay | Admitting: Gastroenterology

## 2011-10-13 NOTE — Telephone Encounter (Signed)
Pt called and asked me to cancel the appt at [facility]; he will keep the appt at [facility] with Dr Abigail Miyamoto. He would like for me to fax everything to Dr Pat Patrick at [facility], attn:Carla. The number did not go to a fax; he will call in the am with the correct number.

## 2011-10-14 NOTE — Telephone Encounter (Signed)
Faxed info to [phone].

## 2011-10-20 ENCOUNTER — Ambulatory Visit (INDEPENDENT_AMBULATORY_CARE_PROVIDER_SITE_OTHER): Payer: BC Managed Care – PPO | Admitting: General Surgery

## 2011-10-21 ENCOUNTER — Telehealth: Payer: Self-pay | Admitting: *Deleted

## 2011-10-21 NOTE — Telephone Encounter (Signed)
Pt lmom for me to cancel his Cardiology appt d/t he's having surgery on Monday, 10/24/11. Cancelled the appt with Caryn Bee at [facility]. lmom for pt to call back for questions.

## 2011-10-24 DIAGNOSIS — C189 Malignant neoplasm of colon, unspecified: Secondary | ICD-10-CM

## 2011-10-24 HISTORY — DX: Malignant neoplasm of colon, unspecified: C18.9

## 2011-10-26 ENCOUNTER — Institutional Professional Consult (permissible substitution): Payer: BC Managed Care – PPO | Admitting: Cardiology

## 2011-10-28 ENCOUNTER — Institutional Professional Consult (permissible substitution): Payer: BC Managed Care – PPO | Admitting: Cardiology

## 2011-10-28 ENCOUNTER — Telehealth: Payer: Self-pay | Admitting: *Deleted

## 2011-10-28 NOTE — Telephone Encounter (Signed)
Pt called to give [place] an update on his surgery. He had his "light Colon Resection" by Dr Donnetta Hutching at [facility] on 10/24/11. This has a new type of prep where pt could eat the night before, no bowel prep, have surgery the next am and then eat that evening to keep the bowel functioning. He had his 1st BM today and is off Oxycodone. 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 He will f/u on 11/17/11 and go from there as far as chemo/radiation etc.

## 2011-11-02 ENCOUNTER — Telehealth: Payer: Self-pay | Admitting: Gastroenterology

## 2011-11-02 NOTE — Telephone Encounter (Signed)
Not needed

## 2012-01-25 HISTORY — PX: COLON SURGERY: SHX602

## 2012-01-25 HISTORY — PX: UPPER GASTROINTESTINAL ENDOSCOPY: SHX188

## 2012-02-20 ENCOUNTER — Telehealth: Payer: Self-pay | Admitting: *Deleted

## 2012-02-20 NOTE — Telephone Encounter (Signed)
Pt reports he f/u with Dr Marciano Sequin at [facility] last week and they need his original CEA result. Faxed lab result to [phone].

## 2012-03-10 ENCOUNTER — Other Ambulatory Visit: Payer: Self-pay

## 2012-05-30 ENCOUNTER — Other Ambulatory Visit: Payer: Self-pay | Admitting: Gastroenterology

## 2012-08-08 ENCOUNTER — Telehealth: Payer: Self-pay | Admitting: Internal Medicine

## 2012-08-08 NOTE — Telephone Encounter (Signed)
Dr. Sheryn Bison told me that this patient, Steven Hawkins, would like to transfer her future GI care to my practice. It appears that she has had a colon cancer removed from the right colon by hemicolectomy at [facility] and subsequent colonoscopy performed within the last several weeks by Dr. Pixie Casino. His recommendation is for repeat colonoscopy in one to 2 years surveillance, "based on your recent genetic testing"  Please schedule this patient for an office followup visit with me in 10-11 months that we can discuss colonoscopy.  I would like to see her first to discuss whether she would like our practice to perform her surveillance procedures or if she will return to [facility] for surveillance  --Office visit in 11 months

## 2012-08-10 ENCOUNTER — Encounter: Payer: Self-pay | Admitting: Internal Medicine

## 2012-08-24 ENCOUNTER — Other Ambulatory Visit: Payer: Self-pay | Admitting: Gastroenterology

## 2012-10-31 DIAGNOSIS — Z23 Encounter for immunization: Secondary | ICD-10-CM | POA: Diagnosis not present

## 2012-11-15 DIAGNOSIS — J9819 Other pulmonary collapse: Secondary | ICD-10-CM | POA: Diagnosis not present

## 2012-11-15 DIAGNOSIS — C19 Malignant neoplasm of rectosigmoid junction: Secondary | ICD-10-CM | POA: Diagnosis not present

## 2012-11-15 DIAGNOSIS — Z9049 Acquired absence of other specified parts of digestive tract: Secondary | ICD-10-CM | POA: Diagnosis not present

## 2012-11-15 DIAGNOSIS — Z09 Encounter for follow-up examination after completed treatment for conditions other than malignant neoplasm: Secondary | ICD-10-CM | POA: Diagnosis not present

## 2012-11-15 DIAGNOSIS — Z85038 Personal history of other malignant neoplasm of large intestine: Secondary | ICD-10-CM | POA: Diagnosis not present

## 2012-11-15 DIAGNOSIS — C189 Malignant neoplasm of colon, unspecified: Secondary | ICD-10-CM | POA: Diagnosis not present

## 2012-11-23 ENCOUNTER — Telehealth: Payer: Self-pay

## 2012-11-23 DIAGNOSIS — C801 Malignant (primary) neoplasm, unspecified: Secondary | ICD-10-CM

## 2012-11-23 NOTE — Telephone Encounter (Addendum)
Left message for call back Identifiable  Medication List and allergies: reviewed and updated  90 day supply/mail order: [facility] [address] Local prescriptions:  [facility] [address]  Immunizations due: pneumonia  A/P:   Doing well since colon surgery per patient Last test show that the cancer markers are steadily coming down per patient Hgb is back to range at 15 per patient No changes to FH--PSH history updated July [facility] is in chart   To Discuss with Provider: Experiencing frank blood at times--Hemorrhoid? Pneumonia Vaccine

## 2012-11-26 DIAGNOSIS — Z85038 Personal history of other malignant neoplasm of large intestine: Secondary | ICD-10-CM | POA: Insufficient documentation

## 2012-11-27 ENCOUNTER — Ambulatory Visit (INDEPENDENT_AMBULATORY_CARE_PROVIDER_SITE_OTHER): Payer: Medicare Other | Admitting: Internal Medicine

## 2012-11-27 ENCOUNTER — Encounter: Payer: Self-pay | Admitting: Internal Medicine

## 2012-11-27 VITALS — BP 129/84 | HR 65 | Temp 98.5°F | Ht 70.5 in

## 2012-11-27 DIAGNOSIS — E785 Hyperlipidemia, unspecified: Secondary | ICD-10-CM | POA: Diagnosis not present

## 2012-11-27 DIAGNOSIS — K2270 Barrett's esophagus without dysplasia: Secondary | ICD-10-CM

## 2012-11-27 DIAGNOSIS — C189 Malignant neoplasm of colon, unspecified: Secondary | ICD-10-CM | POA: Diagnosis not present

## 2012-11-27 DIAGNOSIS — Z8546 Personal history of malignant neoplasm of prostate: Secondary | ICD-10-CM | POA: Diagnosis not present

## 2012-11-27 DIAGNOSIS — Z1331 Encounter for screening for depression: Secondary | ICD-10-CM

## 2012-11-27 DIAGNOSIS — Z0000 Encounter for general adult medical examination without abnormal findings: Secondary | ICD-10-CM

## 2012-11-27 LAB — LIPID PANEL
Cholesterol: 189 mg/dL (ref 0–200)
Total CHOL/HDL Ratio: 4
Triglycerides: 205 mg/dL — ABNORMAL HIGH (ref 0.0–149.0)
VLDL: 41 mg/dL — ABNORMAL HIGH (ref 0.0–40.0)

## 2012-11-27 NOTE — Progress Notes (Signed)
Subjective:    Patient ID: Steven Hawkins, male    DOB: Sep 22, 1945, 67 y.o.   MRN: [id number]  HPI Medicare Wellness Visit: Psychosocial and medical history were reviewed as required by Medicare (history related to abuse, antisocial behavior , firearm risk). Social history: Caffeine: 1 coffee & 1 coke , Alcohol: 3/ week , Tobacco WUJ:WJXBJ Exercise:see below Personal safety/fall risk:no Limitations of activities of daily living:no Seatbelt/ smoke alarm use:yes Healthcare Power of Attorney/Living Will status: in place Ophthalmologic exam status:current Hearing evaluation status:not current Orientation: Oriented X 3 Memory and recall: good Spelling  testing: good Depression/anxiety assessment: denied Foreign travel history: 2012 [place] Immunization status for influenza/pneumonia/ shingles /tetanus:PNA needed Transfusion history:no Preventive health care maintenance status: Colonoscopy as per protocol/standard care:current Dental care:every 6 months Chart reviewed and updated. Active issues reviewed and addressed as documented below.    Review of Systems . He had extensive labs performed @ [facility] which were all normal. The endoscopic & coloscopic results were entered in his past history.He does have occasional rectal bleeding attributed to hemorrhoids. Colonoscopy did apparently reveal hyperplastic polyps. A heart healthy diet is followed; exercise encompasses 40 minutes 5  times per week as  Elliptical & weights without symptoms.  Family history is negative for premature coronary disease. Advanced cholesterol testing reveals  LDL goal is less than 130 ; ideally < 100. Previously on statin; "but I did not like it". Now on supplements Low dose ASA taken Specifically denied are  chest pain, palpitations, dyspnea, or claudication.  Significant abdominal symptoms, memory deficit, or myalgias not present.     Objective:   Physical Exam  Gen.:Thin but healthy and well-nourished in  appearance. Alert, appropriate and cooperative throughout exam. Appears younger than stated age  Head: Normocephalic without obvious abnormalities;  Moustache ; no alopecia  Eyes: No corneal or conjunctival inflammation noted. Pupils equal round reactive to light and accommodation. Extraocular motion intact.  Ears: External  ear exam reveals no significant lesions or deformities. Canals clear .TMs normal. Hearing is grossly decreased on L. Nose: External nasal exam reveals no deformity or inflammation. Nasal mucosa are pink and moist. No lesions or exudates noted.   Mouth: Oral mucosa and oropharynx reveal no lesions or exudates. Teeth in good repair. Neck: No deformities, masses, or tenderness noted. Range of motion & Thyroid normal. Lungs: Normal respiratory effort; chest expands symmetrically. Lungs are clear to auscultation without rales, wheezes, or increased work of breathing. Heart: Normal rate and rhythm. Normal S1 and S2. No gallop, click, or rub. No murmur. Abdomen: Bowel sounds normal; abdomen soft and nontender. No masses, organomegaly or hernias noted. Genitalia: As per Dr Margo Aye                                 Musculoskeletal/extremities: Slightly accentuated curvature of upper thoracic spine.  No clubbing, cyanosis, edema, or significant extremity  deformity noted. Range of motion normal .Tone & strength normal. Hand joints normal . Fingernail  health good. Able to lie down & sit up w/o help. Negative SLR bilaterally Vascular: Carotid, radial artery, dorsalis pedis and  posterior tibial pulses are full and equal. No bruits present. Neurologic: Alert and oriented x3. Deep tendon reflexes symmetrical and normal.        Skin: Intact without suspicious lesions or rashes. Lymph: No cervical, axillary lymphadenopathy present. Psych: Mood and affect are normal. Normally interactive  Assessment &  Plan:  #1 Medicare Wellness Exam; criteria met ; data entered #2 Problem List/Diagnoses reviewed Plan:  Assessments made/ Orders entered

## 2012-11-27 NOTE — Patient Instructions (Signed)
Your next office appointment will be determined based upon review of your pending labs. Those instructions will be transmitted to you through My Chart . 

## 2013-01-22 DIAGNOSIS — C61 Malignant neoplasm of prostate: Secondary | ICD-10-CM | POA: Diagnosis not present

## 2013-01-31 DIAGNOSIS — C61 Malignant neoplasm of prostate: Secondary | ICD-10-CM | POA: Diagnosis not present

## 2013-01-31 DIAGNOSIS — N529 Male erectile dysfunction, unspecified: Secondary | ICD-10-CM | POA: Diagnosis not present

## 2013-05-16 DIAGNOSIS — C19 Malignant neoplasm of rectosigmoid junction: Secondary | ICD-10-CM | POA: Diagnosis not present

## 2013-05-16 DIAGNOSIS — K2270 Barrett's esophagus without dysplasia: Secondary | ICD-10-CM | POA: Diagnosis not present

## 2013-05-16 DIAGNOSIS — C189 Malignant neoplasm of colon, unspecified: Secondary | ICD-10-CM | POA: Diagnosis not present

## 2013-09-25 DIAGNOSIS — L570 Actinic keratosis: Secondary | ICD-10-CM | POA: Diagnosis not present

## 2013-09-25 DIAGNOSIS — L821 Other seborrheic keratosis: Secondary | ICD-10-CM | POA: Diagnosis not present

## 2013-10-30 DIAGNOSIS — Z23 Encounter for immunization: Secondary | ICD-10-CM | POA: Diagnosis not present

## 2013-11-21 DIAGNOSIS — C189 Malignant neoplasm of colon, unspecified: Secondary | ICD-10-CM | POA: Diagnosis not present

## 2013-11-28 DIAGNOSIS — C19 Malignant neoplasm of rectosigmoid junction: Secondary | ICD-10-CM | POA: Diagnosis not present

## 2013-11-28 DIAGNOSIS — C182 Malignant neoplasm of ascending colon: Secondary | ICD-10-CM | POA: Diagnosis not present

## 2013-12-09 DIAGNOSIS — M5030 Other cervical disc degeneration, unspecified cervical region: Secondary | ICD-10-CM | POA: Diagnosis not present

## 2013-12-09 DIAGNOSIS — M5412 Radiculopathy, cervical region: Secondary | ICD-10-CM | POA: Diagnosis not present

## 2013-12-09 DIAGNOSIS — K219 Gastro-esophageal reflux disease without esophagitis: Secondary | ICD-10-CM | POA: Diagnosis not present

## 2013-12-09 DIAGNOSIS — M47812 Spondylosis without myelopathy or radiculopathy, cervical region: Secondary | ICD-10-CM | POA: Diagnosis not present

## 2013-12-15 DIAGNOSIS — Z23 Encounter for immunization: Secondary | ICD-10-CM | POA: Diagnosis not present

## 2013-12-24 DIAGNOSIS — M542 Cervicalgia: Secondary | ICD-10-CM | POA: Diagnosis not present

## 2013-12-26 DIAGNOSIS — H10523 Angular blepharoconjunctivitis, bilateral: Secondary | ICD-10-CM | POA: Diagnosis not present

## 2013-12-26 DIAGNOSIS — M542 Cervicalgia: Secondary | ICD-10-CM | POA: Diagnosis not present

## 2013-12-26 DIAGNOSIS — H43393 Other vitreous opacities, bilateral: Secondary | ICD-10-CM | POA: Diagnosis not present

## 2013-12-26 DIAGNOSIS — M5412 Radiculopathy, cervical region: Secondary | ICD-10-CM | POA: Diagnosis not present

## 2013-12-26 DIAGNOSIS — H10413 Chronic giant papillary conjunctivitis, bilateral: Secondary | ICD-10-CM | POA: Diagnosis not present

## 2013-12-30 DIAGNOSIS — M542 Cervicalgia: Secondary | ICD-10-CM | POA: Diagnosis not present

## 2013-12-30 DIAGNOSIS — M5412 Radiculopathy, cervical region: Secondary | ICD-10-CM | POA: Diagnosis not present

## 2014-01-02 DIAGNOSIS — M5412 Radiculopathy, cervical region: Secondary | ICD-10-CM | POA: Diagnosis not present

## 2014-01-02 DIAGNOSIS — M542 Cervicalgia: Secondary | ICD-10-CM | POA: Diagnosis not present

## 2014-01-03 DIAGNOSIS — C189 Malignant neoplasm of colon, unspecified: Secondary | ICD-10-CM | POA: Diagnosis not present

## 2014-01-07 DIAGNOSIS — M542 Cervicalgia: Secondary | ICD-10-CM | POA: Diagnosis not present

## 2014-01-07 DIAGNOSIS — M5412 Radiculopathy, cervical region: Secondary | ICD-10-CM | POA: Diagnosis not present

## 2014-01-09 DIAGNOSIS — M5412 Radiculopathy, cervical region: Secondary | ICD-10-CM | POA: Diagnosis not present

## 2014-01-09 DIAGNOSIS — M542 Cervicalgia: Secondary | ICD-10-CM | POA: Diagnosis not present

## 2014-01-13 DIAGNOSIS — Z681 Body mass index (BMI) 19 or less, adult: Secondary | ICD-10-CM | POA: Diagnosis not present

## 2014-01-13 DIAGNOSIS — M47812 Spondylosis without myelopathy or radiculopathy, cervical region: Secondary | ICD-10-CM | POA: Diagnosis not present

## 2014-01-13 DIAGNOSIS — M5030 Other cervical disc degeneration, unspecified cervical region: Secondary | ICD-10-CM | POA: Diagnosis not present

## 2014-01-14 DIAGNOSIS — M5412 Radiculopathy, cervical region: Secondary | ICD-10-CM | POA: Diagnosis not present

## 2014-01-14 DIAGNOSIS — M542 Cervicalgia: Secondary | ICD-10-CM | POA: Diagnosis not present

## 2014-01-21 DIAGNOSIS — M542 Cervicalgia: Secondary | ICD-10-CM | POA: Diagnosis not present

## 2014-02-09 IMAGING — CT CT ABD-PELV W/ CM
2 of 5 series · 16 of 46 positions shown, 18 images · IV contrast (Omnipaque 300)
Comparison: None.

CLINICAL DATA: cecal mass identified at colonoscopy

CT ABDOMEN AND PELVIS WITH CONTRAST
TECHNIQUE: Multidetector CT imaging of the abdomen and pelvis was
performed following the standard protocol during bolus
administration of intravenous contrast.
Contrast: 100mL OMNIPAQUE IOHEXOL 300 MG/ML  SOLN

[Series 2: abd/ pel 5mm · axial · 0.70mm/px · z∈[-466,-46]mm · 13 of 94 slices shown, 15 images]
[im 5/94  soft-tissue]
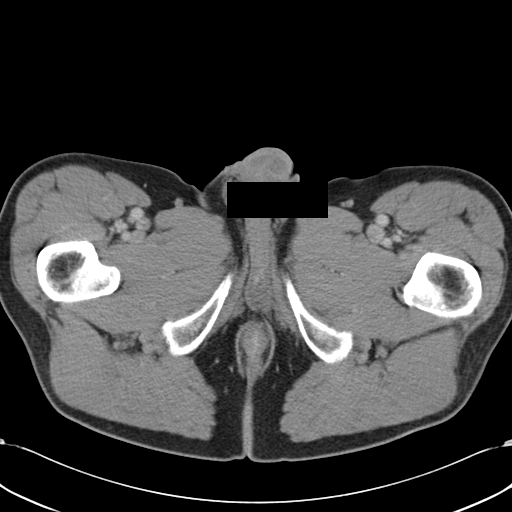
[im 5/94  bone]
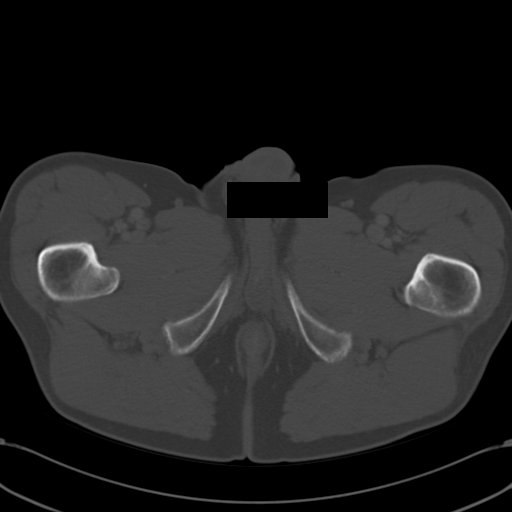
[im 14/94  soft-tissue]
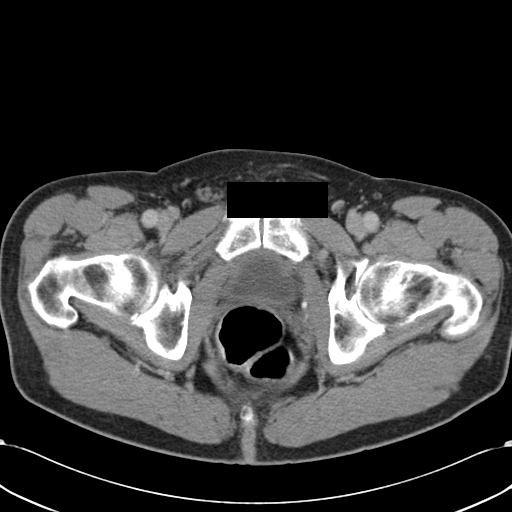
[im 18/94  soft-tissue]
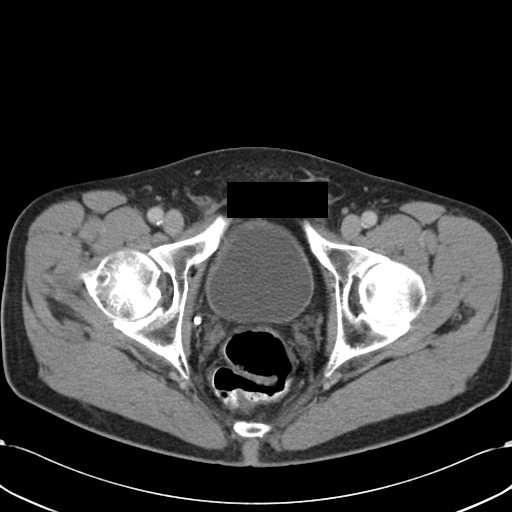
[im 27/94  soft-tissue]
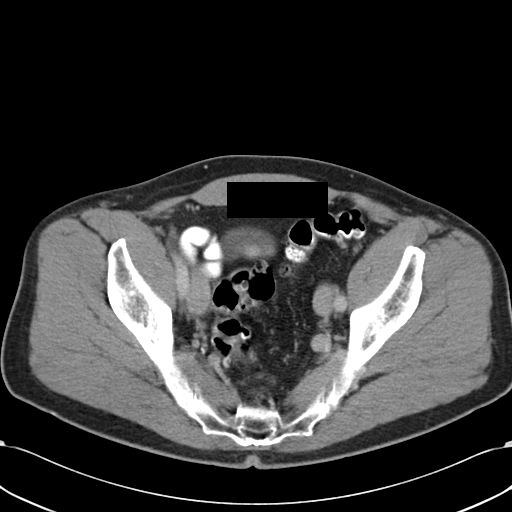
[im 32/94  soft-tissue]
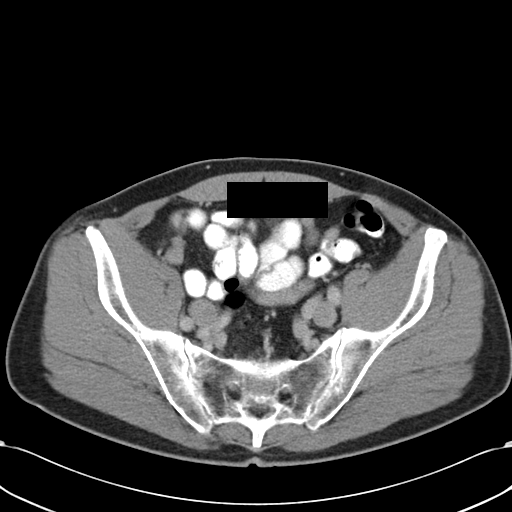
[im 40/94  soft-tissue]
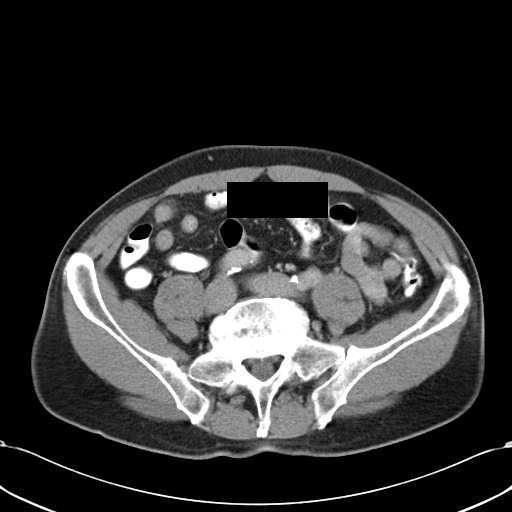
[im 49/94  soft-tissue]
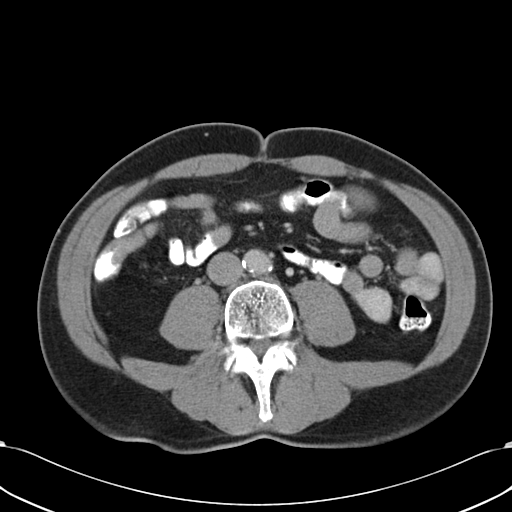
[im 54/94  soft-tissue]
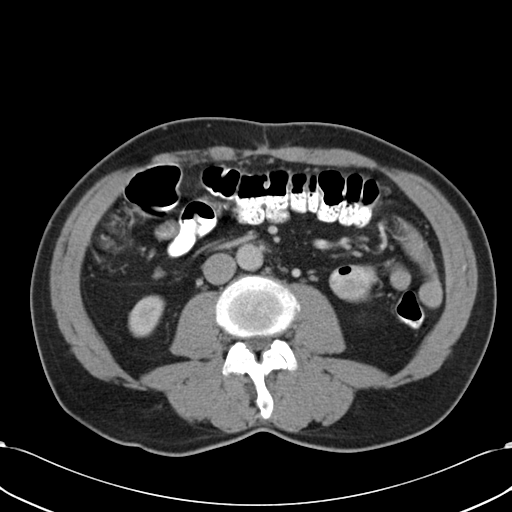
[im 63/94  soft-tissue]
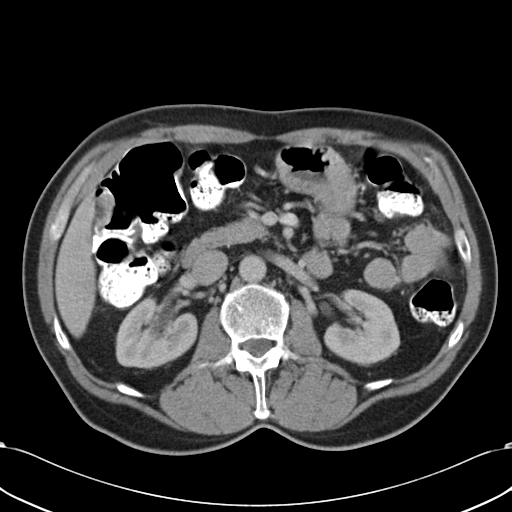
[im 63/94  bone]
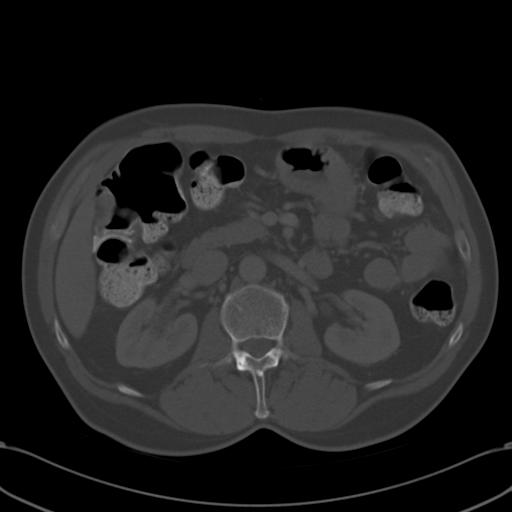
[im 67/94  soft-tissue]
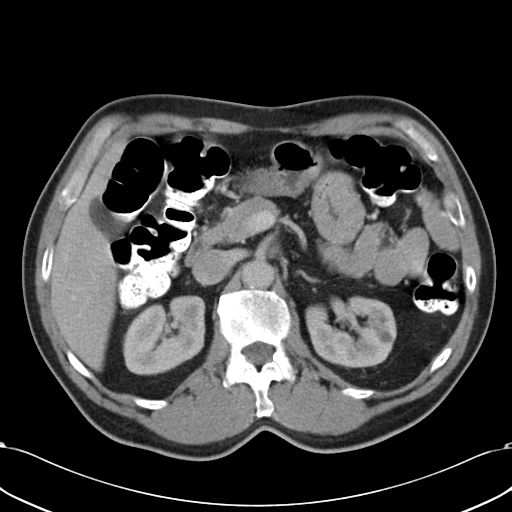
[im 76/94  soft-tissue]
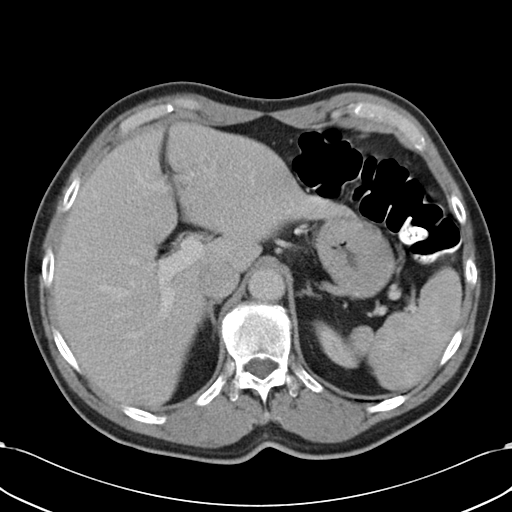
[im 80/94  soft-tissue]
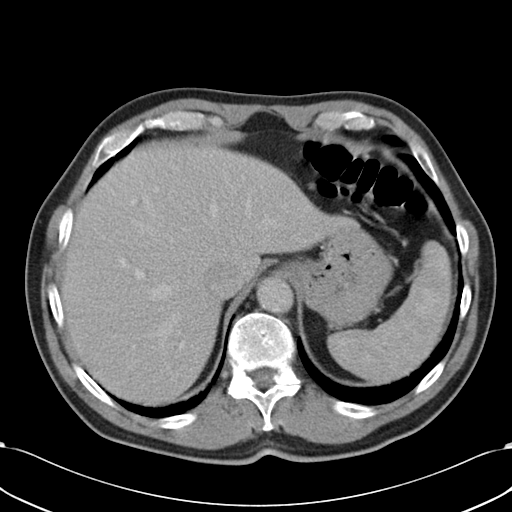
[im 89/94  soft-tissue]
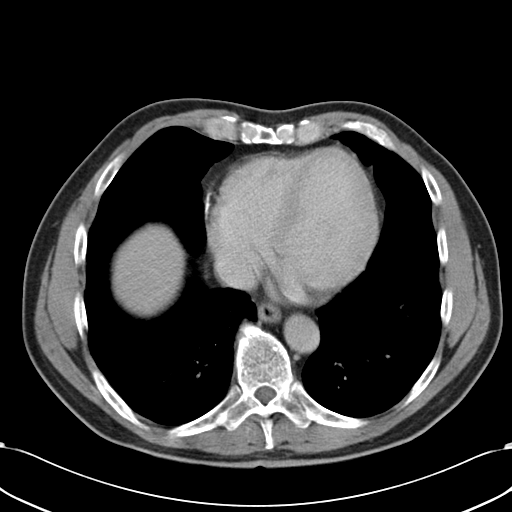

[Series 602: cor · coronal · 0.94mm/px · 3 of 113 slices shown]
[im 38/113  soft-tissue]
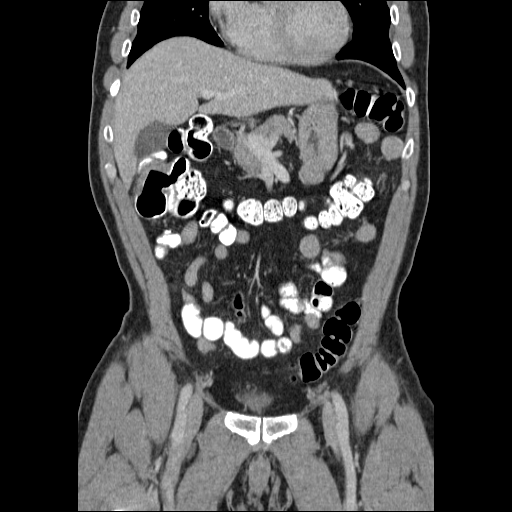
[im 50/113  soft-tissue]
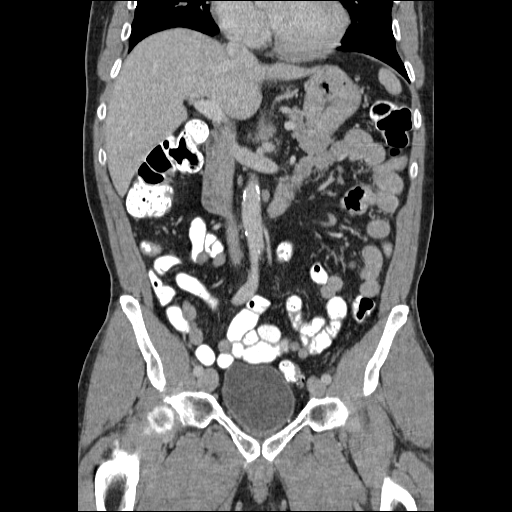
[im 63/113  soft-tissue]
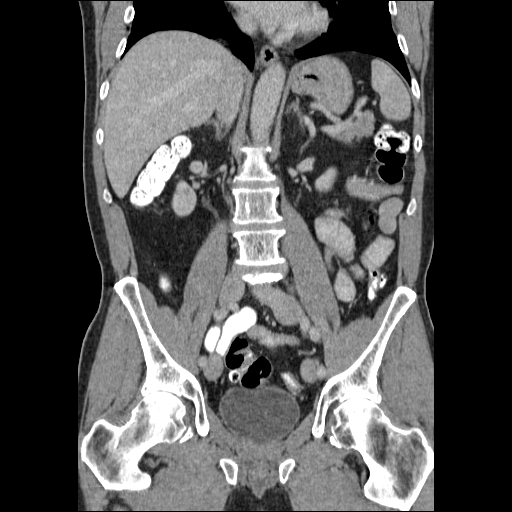

[16 of 46 positions shown; findings below may reference images not displayed]

FINDINGS: Tiny nodule in the left base measures 4.3 mm, image 12.

Within the medial aspect of the right hepatic lobe there is an
mm hypodensity, image 17.  Too small to characterize.  The
gallbladder appears normal.  No biliary dilatation.  The pancreas
is unremarkable.  The spleen is negative.

Normal appearance of both adrenal glands.  There are several cysts
arising from the inferior pole of the left kidney.  There is a
mm hypodensity within the interpolar region of the left kidney.
This is too small to characterize.  Nonobstructing calculus is
noted within the inferior pole the left kidney measuring 3 mm,
image 27. The urinary bladder appears within normal limits.

The stomach and the small bowel loops are normal in course and
caliber.  Mass identified within the cecum is noted along the
anterolateral wall.  This measures approximately 5 x 1 cm.  No
evidence for colonic obstruction.  There are multiple distal
colonic diverticula without acute inflammation.

No free fluid or fluid collections within the abdomen or pelvis.
No peritoneal nodule or mass identified.  There are multiple small
right lower quadrant ileocolic lymph nodes.  These measure up to
8.2 mm, image 41. No enlarged retroperitoneal or pelvic adenopathy.
No inguinal adenopathy.

Review of the visualized bony structures is significant for lumbar
degenerative disc disease.  This is most severe at the L4-5 level.
IMPRESSION: 1.  No acute findings.
2.  Cecal mass compatible with primary colonic neoplasm
3.  Sub centimeter right lower quadrant ileocolic lymph nodes.
4.  No evidence for metastasis to the liver.
5.  Pulmonary nodule in the left base measures 4.3 mm. If the
patient is at high risk for bronchogenic carcinoma, follow-up chest
CT at 1 year is recommended.  If the patient is at low risk, no
follow-up is needed.  This recommendation follows the consensus
statement: Guidelines for Management of Small Pulmonary Nodules
Detected on CT Scans:  A Statement from the [HOSPITAL] as

## 2014-02-19 DIAGNOSIS — C61 Malignant neoplasm of prostate: Secondary | ICD-10-CM | POA: Diagnosis not present

## 2014-02-26 DIAGNOSIS — Z6825 Body mass index (BMI) 25.0-25.9, adult: Secondary | ICD-10-CM | POA: Diagnosis not present

## 2014-02-26 DIAGNOSIS — C61 Malignant neoplasm of prostate: Secondary | ICD-10-CM | POA: Diagnosis not present

## 2014-02-26 DIAGNOSIS — N529 Male erectile dysfunction, unspecified: Secondary | ICD-10-CM | POA: Diagnosis not present

## 2014-04-15 ENCOUNTER — Encounter: Payer: Self-pay | Admitting: Internal Medicine

## 2014-04-15 ENCOUNTER — Other Ambulatory Visit (INDEPENDENT_AMBULATORY_CARE_PROVIDER_SITE_OTHER): Payer: Medicare Other

## 2014-04-15 ENCOUNTER — Other Ambulatory Visit: Payer: Self-pay | Admitting: Internal Medicine

## 2014-04-15 ENCOUNTER — Ambulatory Visit (INDEPENDENT_AMBULATORY_CARE_PROVIDER_SITE_OTHER): Payer: Medicare Other | Admitting: Internal Medicine

## 2014-04-15 VITALS — BP 152/90 | HR 62 | Temp 98.4°F | Resp 15 | Ht 71.0 in | Wt 179.0 lb

## 2014-04-15 DIAGNOSIS — R739 Hyperglycemia, unspecified: Secondary | ICD-10-CM

## 2014-04-15 DIAGNOSIS — R030 Elevated blood-pressure reading, without diagnosis of hypertension: Secondary | ICD-10-CM

## 2014-04-15 DIAGNOSIS — E785 Hyperlipidemia, unspecified: Secondary | ICD-10-CM

## 2014-04-15 DIAGNOSIS — I447 Left bundle-branch block, unspecified: Secondary | ICD-10-CM | POA: Diagnosis not present

## 2014-04-15 DIAGNOSIS — R0789 Other chest pain: Secondary | ICD-10-CM | POA: Diagnosis not present

## 2014-04-15 LAB — TSH: TSH: 2.67 u[IU]/mL (ref 0.35–4.50)

## 2014-04-15 LAB — HEMOGLOBIN A1C: Hgb A1c MFr Bld: 5.7 % (ref 4.6–6.5)

## 2014-04-15 NOTE — Assessment & Plan Note (Signed)
A1c

## 2014-04-15 NOTE — Assessment & Plan Note (Addendum)
NMR Lipoprofile, TSH

## 2014-04-15 NOTE — Progress Notes (Signed)
Pre visit review using our clinic review tool, if applicable. No additional management support is needed unless otherwise documented below in the visit note. 

## 2014-04-15 NOTE — Assessment & Plan Note (Deleted)
CBC

## 2014-04-15 NOTE — Assessment & Plan Note (Addendum)
Blood pressure goals reviewed.  

## 2014-04-15 NOTE — Progress Notes (Signed)
Subjective:    Patient ID: Steven Hawkins, male    DOB: February 24, 1945, 69 y.o.   MRN: [id number]  HPI The patient is here to assess status of active health conditions. He has been having exertional chest pain.  PMH, FH, & Social History reviewed & updated.   He does eat red meat 2-3 times every week as well as fried foods at the same frequency. He's exercising 20 minutes on the elliptical and 20 minutes with weights at the gym 5 times a week. He does experience some substernal chest discomfort on the elliptical which can radiate to the left axilla. Cath was negative in 2004. Both parents had MI in their 82s.  Blood pressures at home range 130/85 -88. In doctors' offices systolic BP is in the is in the 140s to 536R with diastolic in the 44R.  He does have a pending colonoscopy in July. He has no active GI symptoms at this time.  [facility] 11/21/13 labs reviewed;non fasting glucose 140  Review of Systems  Palpitations, tachycardia, exertional dyspnea, paroxysmal nocturnal dyspnea, claudication or edema are absent.  Unexplained weight loss, abdominal pain, significant dyspepsia, dysphagia, melena, rectal bleeding, or persistently small caliber stools are denied.     Objective:   Physical Exam  Gen.: Adequately nourished in appearance. Alert, appropriate and cooperative throughout exam. Appears younger than stated age  Head: Normocephalic without obvious abnormalities;no alopecia  Eyes: No corneal or conjunctival inflammation noted. Pupils equal round reactive to light and accommodation. Extraocular motion intact.  Ears: External  ear exam reveals no significant lesions or deformities. Canals clear .TMs normal. Hearing is decreased on L. Nose: External nasal exam reveals no deformity or inflammation. Nasal mucosa are pink and moist. No lesions or exudates noted.   Mouth: Oral mucosa and oropharynx reveal no lesions or exudates. Teeth in good repair. Neck: No deformities, masses, or  tenderness noted. Range of motion & Thyroid normal Lungs: Normal respiratory effort; chest expands symmetrically. Lungs are clear to auscultation without rales, wheezes, or increased work of breathing. Heart: Normal rate and rhythm. Normal S1 and S2. No gallop, click, or rub. No murmur. Abdomen: Bowel sounds normal; abdomen soft and nontender. No masses, organomegaly or hernias noted. Genitalia:  As per Dr.Hall                                  Musculoskeletal/extremities: No deformity or scoliosis noted of  the thoracic or lumbar spine.  No clubbing, cyanosis, edema, or significant extremity  deformity noted.  Range of motion normal . Tone & strength normal. Hand joints normal.  Fingernail  health good. Crepitus of knees  Able to lie down & sit up w/o help.  Negative SLR bilaterally Vascular: Carotid, radial artery, dorsalis pedis and  posterior tibial pulses are full and equal. No bruits present. Neurologic: Alert and oriented x3. Deep tendon reflexes symmetrical and normal.  Gait normal   Heel & toe walking .  Rhomberg & finger to nose negative      Skin: Intact without suspicious lesions or rashes. Lymph: No cervical, axillary lymphadenopathy present. Psych: Mood and affect are normal. Normally interactive  Assessment & Plan:  #1 chest pain ,exertional #2 See Current Assessment & Plan in Problem List under specific Diagnosis

## 2014-04-15 NOTE — Assessment & Plan Note (Signed)
Cardiology referral  

## 2014-04-15 NOTE — Patient Instructions (Addendum)
  Your next office appointment will be determined based upon review of your pending labs   Those instructions will be transmitted to you through My Chart    Critical values will be called. Followup as needed for any active or acute issue. Please report any significant change in your symptoms.Minimal Blood Pressure Goal= AVERAGE < 140/90;  Ideal is an AVERAGE < 135/85. This AVERAGE should be calculated from @ least 5-7 BP readings taken @ different times of day on different days of week. You should not respond to isolated BP readings , but rather the AVERAGE for that week .Please bring your  blood pressure cuff to office visits to verify that it is reliable.It  can also be checked against the blood pressure device at the pharmacy. Finger or wrist cuffs are not dependable; an arm cuff is.  The Cardiology referral will be scheduled and you'll be notified of the time.Please call the Referral Co-Ordinator @ [phone] if you have not been notified of appointment time within 7-10 days.

## 2014-04-17 LAB — NMR LIPOPROFILE WITH LIPIDS
Cholesterol, Total: 203 mg/dL — ABNORMAL HIGH (ref 100–199)
HDL PARTICLE NUMBER: 28.1 umol/L — AB (ref >=30.5)
HDL Size: 8.2 nm — ABNORMAL LOW (ref >=9.2)
HDL-C: 42 mg/dL (ref >39)
LDL (calc): 130 mg/dL — ABNORMAL HIGH (ref 0–99)
LDL PARTICLE NUMBER: 1951 nmol/L — AB (ref <1000)
LDL Size: 20.4 nm (ref >=20.8)
LP-IR SCORE: 73 — AB (ref <=45)
Large HDL-P: <1.3 umol/L — ABNORMAL LOW (ref >=4.8)
Large VLDL-P: 3 nmol/L — ABNORMAL HIGH (ref <=2.7)
Small LDL Particle Number: 1014 nmol/L — ABNORMAL HIGH (ref <=527)
Triglycerides: 155 mg/dL — ABNORMAL HIGH (ref 0–149)
VLDL Size: 47 nm — ABNORMAL HIGH (ref <=46.6)

## 2014-04-19 ENCOUNTER — Other Ambulatory Visit: Payer: Self-pay | Admitting: Internal Medicine

## 2014-04-19 DIAGNOSIS — E785 Hyperlipidemia, unspecified: Secondary | ICD-10-CM

## 2014-05-29 ENCOUNTER — Ambulatory Visit (INDEPENDENT_AMBULATORY_CARE_PROVIDER_SITE_OTHER): Payer: Medicare Other | Admitting: Internal Medicine

## 2014-05-29 ENCOUNTER — Encounter: Payer: Self-pay | Admitting: Internal Medicine

## 2014-05-29 VITALS — BP 140/82 | HR 77 | Ht 70.0 in | Wt 178.2 lb

## 2014-05-29 DIAGNOSIS — R030 Elevated blood-pressure reading, without diagnosis of hypertension: Secondary | ICD-10-CM

## 2014-05-29 DIAGNOSIS — E785 Hyperlipidemia, unspecified: Secondary | ICD-10-CM

## 2014-05-29 DIAGNOSIS — R5383 Other fatigue: Secondary | ICD-10-CM | POA: Diagnosis not present

## 2014-05-29 DIAGNOSIS — I447 Left bundle-branch block, unspecified: Secondary | ICD-10-CM

## 2014-05-29 DIAGNOSIS — D689 Coagulation defect, unspecified: Secondary | ICD-10-CM | POA: Diagnosis not present

## 2014-05-29 DIAGNOSIS — I200 Unstable angina: Secondary | ICD-10-CM

## 2014-05-29 DIAGNOSIS — Z01818 Encounter for other preprocedural examination: Secondary | ICD-10-CM | POA: Diagnosis not present

## 2014-05-29 NOTE — Patient Instructions (Addendum)
Your physician has requested that you have a cardiac catheterization on a Friday (preferably) with Dr. Debara Pickett. Cardiac catheterization is used to diagnose and/or treat various heart conditions. Doctors may recommend this procedure for a number of different reasons. The most common reason is to evaluate chest pain. Chest pain can be a symptom of coronary artery disease (CAD), and cardiac catheterization can show whether plaque is narrowing or blocking your heart's arteries. This procedure is also used to evaluate the valves, as well as measure the blood flow and oxygen levels in different parts of your heart. For further information please visit [email or web address]. Please follow instruction sheet, as given.  You will need to have blood work Statistician) & a chest x-ray [facility]) 3-5 days prior to this procedure.  Please go to [address] do not need an appointment

## 2014-05-29 NOTE — Progress Notes (Signed)
OFFICE NOTE  Chief Complaint:  Exertional chest pain  Primary Care Physician: Unice Cobble, MD  HPI:  Steven Hawkins  Is a pleasant 69 -year-old male who is coming referred to me for evaluation of chest pain. 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 He says that he's been under a lot of stress recently but continues to exercise. He exercises almost every day and recently he's had some problems with exercise on an elliptical machine where he develops pressure across the top of his chest and into the first part of both arms.  The symptoms occur after aerobic exercise and are relieved at rest. He has no symptoms with weight lifting.  He started to take a break from this and was advised to walk on a treadmill. He finds that if he is able to walk on the treadmill at a slow pace his symptoms are not noticeable, but if he increases the rate or incline he starts to have typical pressure across his chest again radiating down both arms. He has felt a little more fatigue and shortness of breath associated with these episodes. Also of note, he has a left bundle branch block. Apparently he's had this diagnosis for some time but does not have any recollection of prior workup of this.  He had a remote stress test and cardiac catheterization about 15 years ago which apparently was negative,  But he did not have the left bundle branch block at that time. Family history is consistent with coronary disease and his mother that developed in her 106s.  PMHx:  Past Medical History  Diagnosis Date  . Esophageal reflux   . Hiatal hernia   . Barrett's esophagus     Dr Sharlett Iles  . Adenocarcinoma of prostate 2008    Dr Jonette Eva  . Esophagitis, unspecified   . Hyperlipemia   . LBBB (left bundle branch block)   . Personal history of colonic polyps 02/22/2010    hyperplastic  . Elevated blood pressure reading without diagnosis of hypertension   . Acute gastritis without  mention of hemorrhage   . Stricture and stenosis of esophagus   . Anemia   . Colon cancer 10/24/11    [facility]    Past Surgical History  Procedure Laterality Date  . Cardiac catheterization  2004    [facility], negative  . Prostatectomy  2007    Dr Jonette Eva , [facility]  . Inguinal hernia repair  2009  . Upper gastrointestinal endoscopy  2014    Barrett's, stricture X 3, initially 2006  . Colonoscopy with polypectomy      X4; due 2016  . Colon surgery  2014    low grade adenocarcinoma of cecum  . Esophageal dilation      Dr Sharlett Iles    FAMHx:  Family History  Problem Relation Age of Onset  . Diabetes Father   . Prostate cancer Father     in 33s  . Heart attack Father     MI in 50s  . Heart attack Mother     MI in 50s  . Alzheimer's disease Mother   . Lymphoma Maternal Grandmother   . Stroke Neg Hx   . Hypertension Neg Hx   . Colon cancer Brother     SOCHx:   reports that he has never smoked. He has never used smokeless tobacco. He reports that he drinks about 1.8 oz of alcohol per week. He reports  that he does not use illicit drugs.  ALLERGIES:  No Known Allergies  ROS: A comprehensive review of systems was negative except for: Cardiovascular: positive for dyspnea, exertional chest pressure/discomfort and fatigue  HOME MEDS: Current Outpatient Prescriptions  Medication Sig Dispense Refill  . Alum Hydroxide-Mag Carbonate (GAVISCON PO) Take by mouth at bedtime as needed.     . Ascorbic Acid (VITAMIN C) 1000 MG tablet Take 1,000 mg by mouth daily.      Marland Kitchen aspirin EC 81 MG tablet Take 81 mg by mouth daily.    . Cholecalciferol (VITAMIN D-3 PO) Take 1 tablet by mouth daily.    . Coenzyme Q10 (COQ10) 200 MG CAPS Take 1 capsule by mouth daily.      Marland Kitchen Hesperidin 98 % POWD 500 mg daily.     . Linoleic Acid-Sunflower Oil (CLA PO) Take 1,000 mg by mouth daily.    Marland Kitchen loratadine (CLARITIN) 10 MG tablet Take 10 mg by mouth as needed.    . Multiple Vitamins-Minerals  (CENTRUM SILVER PO) Take 1 capsule by mouth daily.      Marland Kitchen NEXIUM 40 MG capsule take 1 capsule by mouth once daily 30 capsule 2  . NON FORMULARY 2-3 capsules daily. Hemp Oil    . Omega-3 Fatty Acids (FISH OIL) 1200 MG CAPS Take 1 capsule by mouth daily.      . Quercetin (QUERCITIN) POWD 500 mg as directed.     . Turmeric Curcumin 500 MG CAPS Take 1 capsule by mouth daily.      Marland Kitchen UNABLE TO FIND Take 1 tablet by mouth daily. Vitamin B 50 complex    . vitamin E (VITAMIN E) 400 UNIT capsule Take 400 Units by mouth daily.       No current facility-administered medications for this visit.    LABS/IMAGING: No results found for this or any previous visit (from the past 48 hour(s)). No results found.  WEIGHTS: Wt Readings from Last 3 Encounters:  05/29/14 178 lb 3.2 oz (80.831 kg)  04/15/14 179 lb 0.6 oz (81.211 kg)  10/07/11 170 lb (77.111 kg)    VITALS: BP 140/82 mmHg  Pulse 77  Ht 5\' 10"  (1.778 m)  Wt 178 lb 3.2 oz (80.831 kg)  BMI 25.57 kg/m2  EXAM: General appearance: alert and no distress Neck: no carotid bruit and no JVD Lungs: clear to auscultation bilaterally Heart: regular rate and rhythm, S1, S2 normal, no murmur, click, rub or gallop Abdomen: soft, non-tender; bowel sounds normal; no masses,  no organomegaly Extremities: extremities normal, atraumatic, no cyanosis or edema Pulses: 2+ and symmetric Skin: Skin color, texture, turgor normal. No rashes or lesions Neurologic: Grossly normal Psych: Pleasant  EKG:  normal sinus rhythm at 77, LBBB  ASSESSMENT: 1.  unstable angina 2.  abnormal EKG with left bundle branch block 3.  history of GERD/Barrett's esophagus  PLAN: 1.    Steven Hawkins is describing typical angina which is then worsening with exercise. It seems to improve with rest but is happening more frequently. This is a pattern of unstable angina and I am recommending heart catheterization. We discussed risks and benefits of heart catheterization in the office  today and he is willing to proceed. He is suitable for a radial approach.  Thanks for the kind referral. I'll be in contact with the results of his cardiac catheterization.  Pixie Casino, MD, [facility] Attending Cardiologist [place] 05/29/2014, 4:52 PM

## 2014-05-30 ENCOUNTER — Other Ambulatory Visit: Payer: Self-pay | Admitting: *Deleted

## 2014-05-30 DIAGNOSIS — IMO0002 Reserved for concepts with insufficient information to code with codable children: Secondary | ICD-10-CM

## 2014-05-30 DIAGNOSIS — I200 Unstable angina: Secondary | ICD-10-CM

## 2014-06-02 ENCOUNTER — Ambulatory Visit
Admission: RE | Admit: 2014-06-02 | Discharge: 2014-06-02 | Disposition: A | Discharge status: Home / Self Care | Payer: Medicare Other | Source: Ambulatory Visit | Attending: Internal Medicine | Admitting: Internal Medicine

## 2014-06-02 DIAGNOSIS — D689 Coagulation defect, unspecified: Secondary | ICD-10-CM | POA: Diagnosis not present

## 2014-06-02 DIAGNOSIS — Z85038 Personal history of other malignant neoplasm of large intestine: Secondary | ICD-10-CM | POA: Diagnosis not present

## 2014-06-02 DIAGNOSIS — C19 Malignant neoplasm of rectosigmoid junction: Secondary | ICD-10-CM | POA: Diagnosis not present

## 2014-06-02 DIAGNOSIS — R5383 Other fatigue: Secondary | ICD-10-CM | POA: Diagnosis not present

## 2014-06-02 DIAGNOSIS — Z01818 Encounter for other preprocedural examination: Secondary | ICD-10-CM

## 2014-06-02 DIAGNOSIS — R079 Chest pain, unspecified: Secondary | ICD-10-CM | POA: Diagnosis not present

## 2014-06-02 LAB — PROTIME-INR
INR: 0.93 (ref <1.50)
PROTHROMBIN TIME: 12.5 s (ref 11.6–15.2)

## 2014-06-02 LAB — APTT: aPTT: 28 seconds (ref 24–37)

## 2014-06-03 LAB — BASIC METABOLIC PANEL
BUN: 22 mg/dL (ref 6–23)
CALCIUM: 9.5 mg/dL (ref 8.4–10.5)
CO2: 28 mEq/L (ref 19–32)
Chloride: 106 mEq/L (ref 96–112)
Creat: 1.15 mg/dL (ref 0.50–1.35)
Glucose, Bld: 93 mg/dL (ref 70–99)
Potassium: 4.4 mEq/L (ref 3.5–5.3)
SODIUM: 141 meq/L (ref 135–145)

## 2014-06-03 LAB — CBC
HCT: 38.8 % — ABNORMAL LOW (ref 39.0–52.0)
Hemoglobin: 12.7 g/dL — ABNORMAL LOW (ref 13.0–17.0)
MCH: 26.5 pg (ref 26.0–34.0)
MCHC: 32.7 g/dL (ref 30.0–36.0)
MCV: 81 fL (ref 78.0–100.0)
MPV: 9.3 fL (ref 8.6–12.4)
PLATELETS: 307 10*3/uL (ref 150–400)
RBC: 4.79 MIL/uL (ref 4.22–5.81)
RDW: 16.6 % — AB (ref 11.5–15.5)
WBC: 8 10*3/uL (ref 4.0–10.5)

## 2014-06-03 LAB — TSH: TSH: 2.217 u[IU]/mL (ref 0.350–4.500)

## 2014-06-06 ENCOUNTER — Encounter (HOSPITAL_COMMUNITY)
Admission: RE | Disposition: A | Discharge status: Home / Self Care | Payer: Medicare Other | Source: Ambulatory Visit | Attending: Internal Medicine

## 2014-06-06 ENCOUNTER — Telehealth: Payer: Self-pay | Admitting: Internal Medicine

## 2014-06-06 ENCOUNTER — Ambulatory Visit (HOSPITAL_COMMUNITY)
Admission: RE | Admit: 2014-06-06 | Discharge: 2014-06-06 | Disposition: A | Discharge status: Home / Self Care | Payer: Medicare Other | Source: Ambulatory Visit | Attending: Internal Medicine | Admitting: Internal Medicine

## 2014-06-06 DIAGNOSIS — I2510 Atherosclerotic heart disease of native coronary artery without angina pectoris: Secondary | ICD-10-CM | POA: Insufficient documentation

## 2014-06-06 DIAGNOSIS — I25110 Atherosclerotic heart disease of native coronary artery with unstable angina pectoris: Secondary | ICD-10-CM

## 2014-06-06 DIAGNOSIS — I429 Cardiomyopathy, unspecified: Secondary | ICD-10-CM | POA: Diagnosis not present

## 2014-06-06 DIAGNOSIS — I200 Unstable angina: Secondary | ICD-10-CM | POA: Diagnosis present

## 2014-06-06 DIAGNOSIS — IMO0002 Reserved for concepts with insufficient information to code with codable children: Secondary | ICD-10-CM

## 2014-06-06 DIAGNOSIS — I428 Other cardiomyopathies: Secondary | ICD-10-CM

## 2014-06-06 DIAGNOSIS — I447 Left bundle-branch block, unspecified: Secondary | ICD-10-CM

## 2014-06-06 HISTORY — PX: CARDIAC CATHETERIZATION: SHX172

## 2014-06-06 SURGERY — LEFT HEART CATH AND CORONARY ANGIOGRAPHY
Anesthesia: LOCAL

## 2014-06-06 MED ORDER — FENTANYL CITRATE (PF) 100 MCG/2ML IJ SOLN
INTRAMUSCULAR | Status: DC | PRN
  Administered 2014-06-06: 25 ug via INTRAVENOUS

## 2014-06-06 MED ORDER — ACETAMINOPHEN 325 MG PO TABS: 650.0000 mg | ORAL_TABLET | ORAL | Status: DC | PRN

## 2014-06-06 MED ORDER — FENTANYL CITRATE (PF) 100 MCG/2ML IJ SOLN
INTRAMUSCULAR | Status: DC | PRN
  Administered 2014-06-06 (×2): 25 ug via INTRAVENOUS

## 2014-06-06 MED ORDER — SODIUM CHLORIDE 0.9 % IV SOLN: 250.0000 mL | INTRAVENOUS | Status: DC | PRN

## 2014-06-06 MED ORDER — ASPIRIN 81 MG PO CHEW
81.0000 mg | CHEWABLE_TABLET | ORAL | Status: AC
  Administered 2014-06-06: 81 mg via ORAL

## 2014-06-06 MED ORDER — MIDAZOLAM HCL 2 MG/2ML IJ SOLN
INTRAMUSCULAR | Status: AC
  Filled 2014-06-06: qty 2

## 2014-06-06 MED ORDER — MIDAZOLAM HCL 2 MG/2ML IJ SOLN
INTRAMUSCULAR | Status: DC | PRN
  Administered 2014-06-06: 1 mg via INTRAVENOUS
  Administered 2014-06-06: 2 mg via INTRAVENOUS

## 2014-06-06 MED ORDER — NITROGLYCERIN 1 MG/10 ML FOR IR/CATH LAB
INTRA_ARTERIAL | Status: AC
  Filled 2014-06-06: qty 10

## 2014-06-06 MED ORDER — VERAPAMIL HCL 2.5 MG/ML IV SOLN
INTRAVENOUS | Status: AC
  Filled 2014-06-06: qty 2

## 2014-06-06 MED ORDER — SODIUM CHLORIDE 0.9 % IJ SOLN: 3.0000 mL | INTRAMUSCULAR | Status: DC | PRN

## 2014-06-06 MED ORDER — FENTANYL CITRATE (PF) 100 MCG/2ML IJ SOLN
INTRAMUSCULAR | Status: AC
  Filled 2014-06-06: qty 2

## 2014-06-06 MED ORDER — SODIUM CHLORIDE 0.9 % IV SOLN: INTRAVENOUS | Status: AC

## 2014-06-06 MED ORDER — HEPARIN SODIUM (PORCINE) 1000 UNIT/ML IJ SOLN
INTRAMUSCULAR | Status: AC
  Filled 2014-06-06: qty 1

## 2014-06-06 MED ORDER — IOHEXOL 350 MG/ML SOLN
INTRAVENOUS | Status: DC | PRN
  Administered 2014-06-06: 50 mL via INTRAVENOUS

## 2014-06-06 MED ORDER — ASPIRIN 81 MG PO CHEW
CHEWABLE_TABLET | ORAL | Status: AC
  Filled 2014-06-06: qty 1

## 2014-06-06 MED ORDER — ONDANSETRON HCL 4 MG/2ML IJ SOLN: 4.0000 mg | Freq: Four times a day (QID) | INTRAMUSCULAR | Status: DC | PRN

## 2014-06-06 MED ORDER — LIDOCAINE HCL (PF) 1 % IJ SOLN
INTRAMUSCULAR | Status: AC
  Filled 2014-06-06: qty 30

## 2014-06-06 MED ORDER — SODIUM CHLORIDE 0.9 % IV SOLN
INTRAVENOUS | Status: DC
  Administered 2014-06-06: 09:00:00 via INTRAVENOUS

## 2014-06-06 MED ORDER — LISINOPRIL-HYDROCHLOROTHIAZIDE 10-12.5 MG PO TABS: 1.0000 | ORAL_TABLET | Freq: Every day | ORAL | Status: DC

## 2014-06-06 MED ORDER — HEPARIN (PORCINE) IN NACL 2-0.9 UNIT/ML-% IJ SOLN
INTRAMUSCULAR | Status: AC
  Filled 2014-06-06: qty 1000

## 2014-06-06 MED ORDER — MIDAZOLAM HCL 5 MG/5ML IJ SOLN
INTRAMUSCULAR | Status: DC | PRN
  Administered 2014-06-06: 1 mg via INTRAVENOUS

## 2014-06-06 MED ORDER — SODIUM CHLORIDE 0.9 % IJ SOLN: 3.0000 mL | Freq: Two times a day (BID) | INTRAMUSCULAR | Status: DC

## 2014-06-06 SURGICAL SUPPLY — 15 items
CATH INFINITI 5FR ANG PIGTAIL (CATHETERS) ×2 IMPLANT
CATH INFINITI 5FR JL4 (CATHETERS) ×2 IMPLANT
CATH INFINITI 5FR MULTPACK ANG (CATHETERS) IMPLANT
CATH INFINITI JR4 5F (CATHETERS) ×2 IMPLANT
CATH OPTITORQUE TIG 4.0 5F (CATHETERS) ×2 IMPLANT
DEVICE RAD COMP TR BAND LRG (VASCULAR PRODUCTS) ×2 IMPLANT
GLIDESHEATH SLEND SS 6F .021 (SHEATH) ×2 IMPLANT
KIT HEART LEFT (KITS) ×2 IMPLANT
PACK CARDIAC CATHETERIZATION (CUSTOM PROCEDURE TRAY) ×2 IMPLANT
SHEATH PINNACLE 5F 10CM (SHEATH) ×2 IMPLANT
SYR MEDRAD MARK V 150ML (SYRINGE) ×2 IMPLANT
TRANSDUCER W/STOPCOCK (MISCELLANEOUS) ×2 IMPLANT
TUBING CIL FLEX 10 FLL-RA (TUBING) ×2 IMPLANT
WIRE EMERALD 3MM-J .035X150CM (WIRE) ×1 IMPLANT
WIRE SAFE-T 1.5MM-J .035X260CM (WIRE) ×2 IMPLANT

## 2014-06-06 NOTE — H&P (Signed)
     INTERVAL PROCEDURE H&P  History and Physical Interval Note:  06/06/2014 10:10 AM  Steven Hawkins has presented today for their planned procedure. The various methods of treatment have been discussed with the patient and family. After consideration of risks, benefits and other options for treatment, the patient has consented to the procedure.  The patients' outpatient history has been reviewed, patient examined, and no change in status from most recent office note within the past 30 days. I have reviewed the patients' chart and labs and will proceed as planned. Questions were answered to the patient's satisfaction.   Cath Lab Visit (complete for each Cath Lab visit)  Clinical Evaluation Leading to the Procedure:   ACS: No.  Non-ACS:    Anginal Classification: CCS III  Anti-ischemic medical therapy: Minimal Therapy (1 class of medications)  Non-Invasive Test Results: No non-invasive testing performed  Prior CABG: No previous CABG  Pixie Casino, MD, [facility] Attending Cardiologist [place] 06/06/2014, 10:10 AM

## 2014-06-06 NOTE — Discharge Instructions (Signed)
Radial Site Care Refer to this sheet in the next few weeks. These instructions provide you with information on caring for yourself after your procedure. Your caregiver may also give you more specific instructions. Your treatment has been planned according to current medical practices, but problems sometimes occur. Call your caregiver if you have any problems or questions after your procedure. HOME CARE INSTRUCTIONS  You may shower the day after the procedure.Remove the bandage (dressing) and gently wash the site with plain soap and water.Gently pat the site dry.  Do not apply powder or lotion to the site.  Do not submerge the affected site in water for 3 to 5 days.  Inspect the site at least twice daily.  Do not flex or bend the affected arm for 24 hours.  No lifting over 5 pounds (2.3 kg) for 5 days after your procedure.  Do not drive home if you are discharged the same day of the procedure. Have someone else drive you.  You may drive 24 hours after the procedure unless otherwise instructed by your caregiver.  Do not operate machinery or power tools for 24 hours.  A responsible adult should be with you for the first 24 hours after you arrive home. What to expect:  Any bruising will usually fade within 1 to 2 weeks.  Blood that collects in the tissue (hematoma) may be painful to the touch. It should usually decrease in size and tenderness within 1 to 2 weeks. SEEK IMMEDIATE MEDICAL CARE IF:  You have unusual pain at the radial site.  You have redness, warmth, swelling, or pain at the radial site.  You have drainage (other than a small amount of blood on the dressing).  You have chills.  You have a fever or persistent symptoms for more than 72 hours.  You have a fever and your symptoms suddenly get worse.  Your arm becomes pale, cool, tingly, or numb.  You have heavy bleeding from the site. Hold pressure on the site. CALL 911 Document Released: 02/12/2010 Document  Revised: 04/04/2011 Document Reviewed: 02/12/2010 [facility] Patient Information 2015 [place], [place]. This information is not intended to replace advice given to you by your health care provider. Make sure you discuss any questions you have with your health care provider. Angiogram, Care After Refer to this sheet in the next few weeks. These instructions provide you with information on caring for yourself after your procedure. Your health care provider may also give you more specific instructions. Your treatment has been planned according to current medical practices, but problems sometimes occur. Call your health care provider if you have any problems or questions after your procedure.  WHAT TO EXPECT AFTER THE PROCEDURE After your procedure, it is typical to have the following sensations:  Minor discomfort or tenderness and a small bump at the catheter insertion site. The bump should usually decrease in size and tenderness within 1 to 2 weeks.  Any bruising will usually fade within 2 to 4 weeks. HOME CARE INSTRUCTIONS   You may need to keep taking blood thinners if they were prescribed for you. Take medicines only as directed by your health care provider.  Do not apply powder or lotion to the site.  Do not take baths, swim, or use a hot tub until your health care provider approves.  You may shower 24 hours after the procedure. Remove the bandage (dressing) and gently wash the site with plain soap and water. Gently pat the site dry.  Inspect the site at  least twice daily.  Limit your activity for the first 48 hours. Do not bend, squat, or lift anything over 20 lb (9 kg) or as directed by your health care provider.  Plan to have someone take you home after the procedure. Follow instructions about when you can drive or return to work. SEEK MEDICAL CARE IF:  You get light-headed when standing up.  You have drainage (other than a small amount of blood on the dressing).  You have  chills.  You have a fever.  You have redness, warmth, swelling, or pain at the insertion site. SEEK IMMEDIATE MEDICAL CARE IF:   You develop chest pain or shortness of breath, feel faint, or pass out.  You have bleeding, swelling larger than a walnut, or drainage from the catheter insertion site.  You develop pain, discoloration, coldness, or severe bruising in the leg or arm that held the catheter.  You develop bleeding from any other place, such as the bowels. You may see bright red blood in your urine or stools, or your stools may appear black and tarry.  You have heavy bleeding from the site. If this happens, hold pressure on the site. CALL 911 MAKE SURE YOU:  Understand these instructions.  Will watch your condition.  Will get help right away if you are not doing well or get worse. Document Released: 07/29/2004 Document Revised: 05/27/2013 Document Reviewed: 06/04/2012 [facility] Patient Information 2015 [place], [place]. This information is not intended to replace advice given to you by your health care provider. Make sure you discuss any questions you have with your health care provider.

## 2014-06-06 NOTE — Progress Notes (Signed)
Site area: RFA Site Prior to Removal:  Level o Pressure Applied For:43min Manual:   yes Patient Status During Pull: stable  Post Pull Site:  Level 0 Post Pull Instructions Given: yes  Post Pull Pulses Present: palpable Dressing Applied:  clear Bedrest begins @ 1155 Comments:

## 2014-06-09 ENCOUNTER — Encounter (HOSPITAL_COMMUNITY): Payer: Self-pay | Admitting: Internal Medicine

## 2014-06-09 MED FILL — Heparin Sodium (Porcine) 2 Unit/ML in Sodium Chloride 0.9%: INTRAMUSCULAR | Qty: 1000 | Status: AC

## 2014-06-09 MED FILL — Lidocaine HCl Local Preservative Free (PF) Inj 1%: INTRAMUSCULAR | Qty: 30 | Status: AC

## 2014-06-11 ENCOUNTER — Telehealth (HOSPITAL_COMMUNITY): Payer: Self-pay | Admitting: *Deleted

## 2014-06-12 NOTE — Telephone Encounter (Signed)
Close encounter 

## 2014-06-16 ENCOUNTER — Ambulatory Visit (HOSPITAL_COMMUNITY)
Admission: RE | Admit: 2014-06-16 | Discharge: 2014-06-16 | Disposition: A | Discharge status: Home / Self Care | Payer: Medicare Other | Source: Ambulatory Visit | Attending: Internal Medicine | Admitting: Internal Medicine

## 2014-06-16 ENCOUNTER — Ambulatory Visit (INDEPENDENT_AMBULATORY_CARE_PROVIDER_SITE_OTHER): Payer: Medicare Other | Admitting: Internal Medicine

## 2014-06-16 ENCOUNTER — Telehealth (HOSPITAL_COMMUNITY): Payer: Self-pay | Admitting: Vascular Surgery

## 2014-06-16 ENCOUNTER — Encounter: Payer: Self-pay | Admitting: Internal Medicine

## 2014-06-16 VITALS — BP 138/84 | HR 72 | Ht 70.0 in | Wt 175.0 lb

## 2014-06-16 DIAGNOSIS — R0602 Shortness of breath: Secondary | ICD-10-CM

## 2014-06-16 DIAGNOSIS — I447 Left bundle-branch block, unspecified: Secondary | ICD-10-CM | POA: Diagnosis not present

## 2014-06-16 DIAGNOSIS — I428 Other cardiomyopathies: Secondary | ICD-10-CM

## 2014-06-16 DIAGNOSIS — I429 Cardiomyopathy, unspecified: Secondary | ICD-10-CM | POA: Insufficient documentation

## 2014-06-16 DIAGNOSIS — I200 Unstable angina: Secondary | ICD-10-CM | POA: Diagnosis not present

## 2014-06-16 DIAGNOSIS — R0609 Other forms of dyspnea: Secondary | ICD-10-CM

## 2014-06-16 DIAGNOSIS — I10 Essential (primary) hypertension: Secondary | ICD-10-CM | POA: Diagnosis not present

## 2014-06-16 DIAGNOSIS — R0789 Other chest pain: Secondary | ICD-10-CM

## 2014-06-16 DIAGNOSIS — R079 Chest pain, unspecified: Secondary | ICD-10-CM | POA: Insufficient documentation

## 2014-06-16 NOTE — Patient Instructions (Signed)
Dr Debara Pickett has ordered a cardiometabolic test - this is done @ [facility].  Your physician recommends that you schedule a follow-up appointment in 2-3 months - after your cardiopulmonary metabolic test   What is a Cardiopulmonary Exercise Test (CPET)?   The Cardiopulmonary Exercise Test is a highly sensitive, non-invasive stress test. It is considered a stress test because the exercise stresses your body's systems by making them work faster and harder. A disease or condition that affects the heart, lungs or muscles will limit how much faster and harder these systems can work. A CPET assesses how well the heart, lungs, and muscles are working individually, and how these systems are working in unison. Your heart and lungs work together to deliver oxygen to your muscles, where it is used to make energy, and to remove carbon dioxide from your body.  The full cardiopulmonary system is assessed during a CPET by measuring the amount of oxygen your body is using, the amount of carbon dioxide it is producing, your breathing pattern, and electrocardiogram (EKG) while you are riding a stationary bicycle.  The traditional treadmill stress test only relies on the EKG, which only partially assesses the heart and nothing else. Besides detecting problems in multiple body systems, the CPET is also used to monitor changes in your disease condition, the effect of certain medications on your body, and if medical therapy is improving your condition.  What conditions can be detected/monitored by the Cardiopulmonary ExerciseTest?  Heart, lung, and metabolic conditions may cause shortness of breath, exercise intolerance or discomfort and pain in the chest. The CPET is the only test that can simultaneously determine which of these systems is causing the problem

## 2014-06-16 NOTE — Progress Notes (Signed)
OFFICE NOTE  Chief Complaint:  Follow-up cathteterization  Primary Care Physician: Unice Cobble, MD  HPI:  Satira Sark  Is a pleasant 69 -year-old male who is coming referred to me for evaluation of chest pain. 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 He says that he's been under a lot of stress recently but continues to exercise. He exercises almost every day and recently he's had some problems with exercise on an elliptical machine where he develops pressure across the top of his chest and into the first part of both arms.  The symptoms occur after aerobic exercise and are relieved at rest. He has no symptoms with weight lifting.  He started to take a break from this and was advised to walk on a treadmill. He finds that if he is able to walk on the treadmill at a slow pace his symptoms are not noticeable, but if he increases the rate or incline he starts to have typical pressure across his chest again radiating down both arms. He has felt a little more fatigue and shortness of breath associated with these episodes. Also of note, he has a left bundle branch block. Apparently he's had this diagnosis for some time but does not have any recollection of prior workup of this.  He had a remote stress test and cardiac catheterization about 15 years ago which apparently was negative,  But he did not have the left bundle branch block at that time. Family history is consistent with coronary disease and his mother that developed in her 45s.  Mr. Lipsey returns today for hospital follow-up. He underwent cardiac catheterization by myself which showed a 40% proximal to mid LAD lesion however no other significant stenosis. EF however was moderate to severely reduced at about 35-40%. I started him on lisinopril HCTZ and he seems to be doing well with that medication. Blood pressure is adequate today. He reported one episode when he was doing significant work outside  that he was dehydrated and had some leg cramping. He got potassium in his symptoms improve. He had an echocardiogram this morning which I ordered.  I personally reviewed the images and it shows a mild increase in LV function up to an EF of 40-45%. There is global hypokinesis with mild mitral regurgitation. The formal echo read should be out later today. Surprisingly, he continues to have some chest discomfort only when working out on the elliptical machine. 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 His wife noted though that he was a little bit wheezy when he came in. He reports some seasonal allergies which may be a component of this. He's never been a smoker. Echo does not indicate volume overload and his LVEDP during catheterization indicates that he is pretty much euvolemic. I'm still at a loss as a cause of his chest discomfort with exertion.  PMHx:  Past Medical History  Diagnosis Date  . Esophageal reflux   . Hiatal hernia   . Barrett's esophagus     Dr Sharlett Iles  . Adenocarcinoma of prostate 2008    Dr Jonette Eva  . Esophagitis, unspecified   . Hyperlipemia   . LBBB (left bundle branch block)   . Personal history of colonic polyps 02/22/2010    hyperplastic  . Elevated blood pressure reading without diagnosis of hypertension   . Acute gastritis without mention of hemorrhage   .  Stricture and stenosis of esophagus   . Anemia   . Colon cancer 10/24/11    [facility]    Past Surgical History  Procedure Laterality Date  . Cardiac catheterization  2004    [facility], negative  . Prostatectomy  2007    Dr Jonette Eva , [facility]  . Inguinal hernia repair  2009  . Upper gastrointestinal endoscopy  2014    Barrett's, stricture X 3, initially 2006  . Colonoscopy with polypectomy      X4; due 2016  . Colon surgery  2014    low grade adenocarcinoma of cecum  . Esophageal dilation      Dr  Sharlett Iles  . Cardiac catheterization N/A 06/06/2014    Procedure: Left Heart Cath and Coronary Angiography;  Surgeon: Pixie Casino, MD;  Location: [facility];  Service: Cardiovascular;  Laterality: N/A;    FAMHx:  Family History  Problem Relation Age of Onset  . Diabetes Father   . Prostate cancer Father     in 87s  . Heart attack Father     MI in 73s  . Heart attack Mother     MI in 69s  . Alzheimer's disease Mother   . Lymphoma Maternal Grandmother   . Stroke Neg Hx   . Hypertension Neg Hx   . Colon cancer Brother     SOCHx:   reports that he has never smoked. He has never used smokeless tobacco. He reports that he drinks about 1.8 oz of alcohol per week. He reports that he does not use illicit drugs.  ALLERGIES:  No Known Allergies  ROS: A comprehensive review of systems was negative except for: Cardiovascular: positive for dyspnea, exertional chest pressure/discomfort and fatigue  HOME MEDS: Current Outpatient Prescriptions  Medication Sig Dispense Refill  . Alum Hydroxide-Mag Carbonate (GAVISCON PO) Take by mouth at bedtime as needed.     . Ascorbic Acid (VITAMIN C) 1000 MG tablet Take 1,000 mg by mouth daily.      Marland Kitchen aspirin EC 81 MG tablet Take 81 mg by mouth daily.    . Cholecalciferol (VITAMIN D-3 PO) Take 1 tablet by mouth daily.    Marland Kitchen co-enzyme Q-10 50 MG capsule Take 50 mg by mouth daily.    Marland Kitchen Hesperidin 98 % POWD Take 500 mg by mouth daily.     . Linoleic Acid-Sunflower Oil (CLA PO) Take 1,000 mg by mouth daily.    Marland Kitchen lisinopril-hydrochlorothiazide (PRINZIDE,ZESTORETIC) 10-12.5 MG per tablet Take 1 tablet by mouth daily. 30 tablet 6  . loratadine (CLARITIN) 10 MG tablet Take 10 mg by mouth as needed.    . Multiple Vitamins-Minerals (CENTRUM SILVER PO) Take 1 capsule by mouth daily.      Marland Kitchen NEXIUM 40 MG capsule take 1 capsule by mouth once daily 30 capsule 2  . NON FORMULARY 2-3 capsules daily. Hemp Oil    . Omega-3 Fatty Acids (FISH OIL) 1200 MG CAPS  Take 1 capsule by mouth daily.      . Quercetin (QUERCITIN) POWD Take 500 mg by mouth daily.     . Turmeric Curcumin 500 MG CAPS Take 1 capsule by mouth daily.      Marland Kitchen UNABLE TO FIND Take 1 tablet by mouth daily. Vitamin B 50 complex    . vitamin E (VITAMIN E) 400 UNIT capsule Take 400 Units by mouth daily.       No current facility-administered medications for this visit.    LABS/IMAGING: No results  found for this or any previous visit (from the past 48 hour(s)). No results found.  WEIGHTS: Wt Readings from Last 3 Encounters:  06/16/14 175 lb (79.379 kg)  06/06/14 174 lb (78.926 kg)  05/29/14 178 lb 3.2 oz (80.831 kg)    VITALS: BP 138/84 mmHg  Pulse 72  Ht 5\' 10"  (1.778 m)  Wt 175 lb (79.379 kg)  BMI 25.11 kg/m2  EXAM: Deferred  EKG: Deferred  ASSESSMENT: 1.  Chest pain - at most 40% proximal to mid LAD stenosis 2.  Abnormal EKG with left bundle branch block 3.  Nonischemic cardiomyopathy, EF 40-45% by echo, NYHA Class II symptoms 4.  History of GERD/Barrett's esophagus  PLAN: 1.    Mr. Allinson continues to have chest discomfort although had no significant obstructive coronary disease by cath. He also shortness of breath, but interestingly this only seems to occur on the elliptical machine. I like to see if we can re-create his exercise. He did tell me at periods of time his heart rate jumps up which could be an exercise-induced arrhythmia. I'll schedule him for cardio metabolic testing which will also include full pulmonary function testing. I recommended a low-dose beta blocker which may be helpful if he is having small vessel ischemia or with his spiking heart rate during exercise. He did not want add that right now, but there is indication given his cardiomyopathy. Plan to see him back in a month after his cardio metabolic testing. If there is an issue with heart rate slowed during exercise, adding low-dose beta blocker makes sense.  Pixie Casino, MD,  [facility] Attending Cardiologist [place] 06/16/2014, 11:11 AM

## 2014-06-25 ENCOUNTER — Ambulatory Visit (HOSPITAL_COMMUNITY): Payer: Medicare Other | Attending: Internal Medicine

## 2014-06-25 DIAGNOSIS — I429 Cardiomyopathy, unspecified: Secondary | ICD-10-CM | POA: Diagnosis not present

## 2014-06-25 DIAGNOSIS — I428 Other cardiomyopathies: Secondary | ICD-10-CM

## 2014-06-25 DIAGNOSIS — R0602 Shortness of breath: Secondary | ICD-10-CM | POA: Insufficient documentation

## 2014-06-27 ENCOUNTER — Ambulatory Visit: Payer: Medicare Other | Admitting: Internal Medicine

## 2014-06-27 DIAGNOSIS — R0602 Shortness of breath: Secondary | ICD-10-CM

## 2014-07-10 NOTE — Telephone Encounter (Signed)
Left pt a message for appt

## 2014-08-08 DIAGNOSIS — K219 Gastro-esophageal reflux disease without esophagitis: Secondary | ICD-10-CM | POA: Diagnosis not present

## 2014-08-08 DIAGNOSIS — K640 First degree hemorrhoids: Secondary | ICD-10-CM | POA: Diagnosis not present

## 2014-08-08 DIAGNOSIS — K5730 Diverticulosis of large intestine without perforation or abscess without bleeding: Secondary | ICD-10-CM | POA: Diagnosis not present

## 2014-08-08 DIAGNOSIS — K621 Rectal polyp: Secondary | ICD-10-CM | POA: Diagnosis not present

## 2014-08-08 DIAGNOSIS — K2270 Barrett's esophagus without dysplasia: Secondary | ICD-10-CM | POA: Diagnosis not present

## 2014-08-08 DIAGNOSIS — Z85038 Personal history of other malignant neoplasm of large intestine: Secondary | ICD-10-CM | POA: Diagnosis not present

## 2014-08-08 DIAGNOSIS — Z79899 Other long term (current) drug therapy: Secondary | ICD-10-CM | POA: Diagnosis not present

## 2014-08-08 DIAGNOSIS — Z85048 Personal history of other malignant neoplasm of rectum, rectosigmoid junction, and anus: Secondary | ICD-10-CM | POA: Diagnosis not present

## 2014-08-08 DIAGNOSIS — D128 Benign neoplasm of rectum: Secondary | ICD-10-CM | POA: Diagnosis not present

## 2014-08-08 DIAGNOSIS — Z08 Encounter for follow-up examination after completed treatment for malignant neoplasm: Secondary | ICD-10-CM | POA: Diagnosis not present

## 2014-08-08 DIAGNOSIS — K644 Residual hemorrhoidal skin tags: Secondary | ICD-10-CM | POA: Diagnosis not present

## 2014-08-08 DIAGNOSIS — K635 Polyp of colon: Secondary | ICD-10-CM | POA: Diagnosis not present

## 2014-08-08 DIAGNOSIS — Z980 Intestinal bypass and anastomosis status: Secondary | ICD-10-CM | POA: Diagnosis not present

## 2014-08-08 DIAGNOSIS — Z8546 Personal history of malignant neoplasm of prostate: Secondary | ICD-10-CM | POA: Diagnosis not present

## 2014-08-08 LAB — HM COLONOSCOPY

## 2014-08-15 ENCOUNTER — Encounter: Payer: Self-pay | Admitting: Internal Medicine

## 2014-08-18 ENCOUNTER — Encounter: Payer: Self-pay | Admitting: Internal Medicine

## 2014-08-18 ENCOUNTER — Ambulatory Visit (INDEPENDENT_AMBULATORY_CARE_PROVIDER_SITE_OTHER): Payer: Medicare Other | Admitting: Internal Medicine

## 2014-08-18 VITALS — BP 114/68 | HR 88 | Ht 70.0 in | Wt 176.8 lb

## 2014-08-18 DIAGNOSIS — I209 Angina pectoris, unspecified: Secondary | ICD-10-CM | POA: Diagnosis not present

## 2014-08-18 DIAGNOSIS — I447 Left bundle-branch block, unspecified: Secondary | ICD-10-CM

## 2014-08-18 DIAGNOSIS — I10 Essential (primary) hypertension: Secondary | ICD-10-CM | POA: Diagnosis not present

## 2014-08-18 DIAGNOSIS — I200 Unstable angina: Secondary | ICD-10-CM | POA: Diagnosis not present

## 2014-08-18 DIAGNOSIS — I428 Other cardiomyopathies: Secondary | ICD-10-CM

## 2014-08-18 DIAGNOSIS — I429 Cardiomyopathy, unspecified: Secondary | ICD-10-CM

## 2014-08-18 MED ORDER — CARVEDILOL 6.25 MG PO TABS: 6.2500 mg | ORAL_TABLET | Freq: Two times a day (BID) | ORAL | Status: DC

## 2014-08-18 NOTE — Progress Notes (Signed)
OFFICE NOTE  Chief Complaint:  Follow-up cardio metabolic testing  Primary Care Physician: Unice Cobble, MD  HPI:  Steven Hawkins  Is a pleasant 69 -year-old male who is coming referred to me for evaluation of chest pain. 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 He says that he's been under a lot of stress recently but continues to exercise. He exercises almost every day and recently he's had some problems with exercise on an elliptical machine where he develops pressure across the top of his chest and into the first part of both arms.  The symptoms occur after aerobic exercise and are relieved at rest. He has no symptoms with weight lifting.  He started to take a break from this and was advised to walk on a treadmill. He finds that if he is able to walk on the treadmill at a slow pace his symptoms are not noticeable, but if he increases the rate or incline he starts to have typical pressure across his chest again radiating down both arms. He has felt a little more fatigue and shortness of breath associated with these episodes. Also of note, he has a left bundle branch block. Apparently he's had this diagnosis for some time but does not have any recollection of prior workup of this.  He had a remote stress test and cardiac catheterization about 15 years ago which apparently was negative,  But he did not have the left bundle branch block at that time. Family history is consistent with coronary disease and his mother that developed in her 12s.  Mr. Cuadros returns today for hospital follow-up. He underwent cardiac catheterization by myself which showed a 40% proximal to mid LAD lesion however no other significant stenosis. EF however was moderate to severely reduced at about 35-40%. I started him on lisinopril HCTZ and he seems to be doing well with that medication. Blood pressure is adequate today. He reported one episode when he was doing significant work  outside that he was dehydrated and had some leg cramping. He got potassium in his symptoms improve. He had an echocardiogram this morning which I ordered.  I personally reviewed the images and it shows a mild increase in LV function up to an EF of 40-45%. There is global hypokinesis with mild mitral regurgitation. The formal echo read should be out later today. Surprisingly, he continues to have some chest discomfort only when working out on the elliptical machine. 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 His wife noted though that he was a little bit wheezy when he came in. He reports some seasonal allergies which may be a component of this. He's never been a smoker. Echo does not indicate volume overload and his LVEDP during catheterization indicates that he is pretty much euvolemic. I'm still at a loss as a cause of his chest discomfort with exertion.  I saw Mr. Kersh back in the office today. He underwent a cardio metabolic test which indicated a flattened VE/V CO2 curve and an exuberant heart rate response to exercise with max predicted heart rate 110%. He did report some shortness of breath and exercise and a small amount of chest discomfort. His performance was described as normal however there was a very mild circulatory limitation based on his perfusion curves. I suspect this may be related to his degree of cardiomyopathy and/or small vessel coronary ischemia  based on his recent cath findings.  PMHx:  Past Medical History  Diagnosis Date  . Esophageal reflux   . Hiatal hernia   . Barrett's esophagus     Dr Sharlett Iles  . Adenocarcinoma of prostate 2008    Dr Jonette Eva  . Esophagitis, unspecified   . Hyperlipemia   . LBBB (left bundle branch block)   . Personal history of colonic polyps 02/22/2010    hyperplastic  . Elevated blood pressure reading without diagnosis of hypertension   . Acute  gastritis without mention of hemorrhage   . Stricture and stenosis of esophagus   . Anemia   . Colon cancer 10/24/11    [facility]    Past Surgical History  Procedure Laterality Date  . Cardiac catheterization  2004    [facility], negative  . Prostatectomy  2007    Dr Jonette Eva , [facility]  . Inguinal hernia repair  2009  . Upper gastrointestinal endoscopy  2014    Barrett's, stricture X 3, initially 2006  . Colonoscopy with polypectomy      X4; due 2016  . Colon surgery  2014    low grade adenocarcinoma of cecum  . Esophageal dilation      Dr Sharlett Iles  . Cardiac catheterization N/A 06/06/2014    Procedure: Left Heart Cath and Coronary Angiography;  Surgeon: Pixie Casino, MD;  Location: [place] [facility];  Service: Cardiovascular;  Laterality: N/A;    FAMHx:  Family History  Problem Relation Age of Onset  . Diabetes Father   . Prostate cancer Father     in 51s  . Heart attack Father     MI in 92s  . Heart attack Mother     MI in 77s  . Alzheimer's disease Mother   . Lymphoma Maternal Grandmother   . Stroke Neg Hx   . Hypertension Neg Hx   . Colon cancer Brother     SOCHx:   reports that he has never smoked. He has never used smokeless tobacco. He reports that he drinks about 1.8 oz of alcohol per week. He reports that he does not use illicit drugs.  ALLERGIES:  No Known Allergies  ROS: A comprehensive review of systems was negative except for: Cardiovascular: positive for dyspnea, exertional chest pressure/discomfort and fatigue  HOME MEDS: Current Outpatient Prescriptions  Medication Sig Dispense Refill  . Alum Hydroxide-Mag Carbonate (GAVISCON PO) Take by mouth at bedtime as needed.     . Ascorbic Acid (VITAMIN C) 1000 MG tablet Take 1,000 mg by mouth daily.      Marland Kitchen aspirin EC 81 MG tablet Take 81 mg by mouth daily.    . Cholecalciferol (VITAMIN D-3 PO) Take 1 tablet by mouth daily.    Marland Kitchen co-enzyme Q-10 50 MG capsule Take 50 mg by mouth daily.    Marland Kitchen  Hesperidin 98 % POWD Take 500 mg by mouth daily.     . Linoleic Acid-Sunflower Oil (CLA PO) Take 1,000 mg by mouth daily.    Marland Kitchen lisinopril-hydrochlorothiazide (PRINZIDE,ZESTORETIC) 10-12.5 MG per tablet Take 1 tablet by mouth daily. 30 tablet 6  . loratadine (CLARITIN) 10 MG tablet Take 10 mg by mouth as needed.    . Multiple Vitamins-Minerals (CENTRUM SILVER PO) Take 1 capsule by mouth daily.      Marland Kitchen NEXIUM 40 MG capsule take 1 capsule by mouth once daily 30 capsule 2  . NON FORMULARY 2-3 capsules daily. Hemp Oil    . Omega-3 Fatty  Acids (FISH OIL) 1200 MG CAPS Take 1 capsule by mouth daily.      . Quercetin (QUERCITIN) POWD Take 500 mg by mouth daily.     . Turmeric Curcumin 500 MG CAPS Take 1 capsule by mouth daily.      Marland Kitchen UNABLE TO FIND Take 1 tablet by mouth daily. Vitamin B 50 complex    . vitamin E (VITAMIN E) 400 UNIT capsule Take 400 Units by mouth daily.      . carvedilol (COREG) 6.25 MG tablet Take 1 tablet (6.25 mg total) by mouth 2 (two) times daily. 60 tablet 3   No current facility-administered medications for this visit.    LABS/IMAGING: No results found for this or any previous visit (from the past 48 hour(s)). No results found.  WEIGHTS: Wt Readings from Last 3 Encounters:  08/18/14 176 lb 12.8 oz (80.196 kg)  06/16/14 175 lb (79.379 kg)  06/06/14 174 lb (78.926 kg)    VITALS: BP 114/68 mmHg  Pulse 88  Ht 5\' 10"  (1.778 m)  Wt 176 lb 12.8 oz (80.196 kg)  BMI 25.37 kg/m2  EXAM: Deferred  EKG: Deferred  ASSESSMENT: 1.  Chest pain - at most 40% proximal to mid LAD stenosis 2.  Abnormal EKG with left bundle branch block 3.  Nonischemic cardiomyopathy, EF 40-45% by echo, NYHA Class II symptoms 4.  History of GERD/Barrett's esophagus 5. Small vessel ischemia/mild circulatory limitation on cardio metabolic testing  PLAN: 1.    Mr. Sigg does have a degree of small vessel ischemia and mild secretory limitation on cardio metabolic testing. I think he would  again benefit from low-dose beta blocker. This is also indicated for his cardiomyopathy. He seems to be agreeable to it now and will start carvedilol 6.25 mg twice daily. Plan to see him back in about 3 months to see if his symptoms have improved. He is also on an ACE inhibitor and blood pressure is much better controlled. Plan to repeat an echocardiogram in 6 months after the initial study to look for changes in LV function. I also mentioned that he could benefit from supplements such as L-arginine and coenzyme Q10 which may help with small vessel ischemia.  Pixie Casino, MD, [facility] Attending Cardiologist [place] [facility] 08/18/2014, 8:52 AM

## 2014-08-18 NOTE — Patient Instructions (Signed)
Your physician has recommended you make the following change in your medication... START carvedilol 6.25mg  twice daily  Your physician recommends that you schedule a follow-up appointment in 3 months with Dr. Debara Pickett

## 2014-10-01 ENCOUNTER — Encounter: Payer: Self-pay | Admitting: Gastroenterology

## 2014-10-01 DIAGNOSIS — L821 Other seborrheic keratosis: Secondary | ICD-10-CM | POA: Diagnosis not present

## 2014-10-22 DIAGNOSIS — Z23 Encounter for immunization: Secondary | ICD-10-CM | POA: Diagnosis not present

## 2014-11-20 ENCOUNTER — Encounter: Payer: Self-pay | Admitting: Internal Medicine

## 2014-11-20 ENCOUNTER — Ambulatory Visit (INDEPENDENT_AMBULATORY_CARE_PROVIDER_SITE_OTHER): Payer: Medicare Other | Admitting: Internal Medicine

## 2014-11-20 VITALS — BP 112/70 | HR 75 | Ht 70.0 in | Wt 180.3 lb

## 2014-11-20 DIAGNOSIS — I429 Cardiomyopathy, unspecified: Secondary | ICD-10-CM

## 2014-11-20 DIAGNOSIS — I428 Other cardiomyopathies: Secondary | ICD-10-CM

## 2014-11-20 DIAGNOSIS — I447 Left bundle-branch block, unspecified: Secondary | ICD-10-CM

## 2014-11-20 DIAGNOSIS — R0609 Other forms of dyspnea: Secondary | ICD-10-CM | POA: Diagnosis not present

## 2014-11-20 DIAGNOSIS — R0789 Other chest pain: Secondary | ICD-10-CM | POA: Diagnosis not present

## 2014-11-20 DIAGNOSIS — I200 Unstable angina: Secondary | ICD-10-CM

## 2014-11-20 MED ORDER — LISINOPRIL-HYDROCHLOROTHIAZIDE 10-12.5 MG PO TABS: 1.0000 | ORAL_TABLET | Freq: Every day | ORAL | Status: DC

## 2014-11-20 MED ORDER — CARVEDILOL 6.25 MG PO TABS: 6.2500 mg | ORAL_TABLET | Freq: Two times a day (BID) | ORAL | Status: DC

## 2014-11-20 NOTE — Progress Notes (Signed)
OFFICE NOTE  Chief Complaint:  Routine follow-up  Primary Care Physician: Unice Cobble, MD  HPI:  Satira Sark  Is a pleasant 69 -year-old male who is coming referred to me for evaluation of chest pain. 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 He says that he's been under a lot of stress recently but continues to exercise. He exercises almost every day and recently he's had some problems with exercise on an elliptical machine where he develops pressure across the top of his chest and into the first part of both arms.  The symptoms occur after aerobic exercise and are relieved at rest. He has no symptoms with weight lifting.  He started to take a break from this and was advised to walk on a treadmill. He finds that if he is able to walk on the treadmill at a slow pace his symptoms are not noticeable, but if he increases the rate or incline he starts to have typical pressure across his chest again radiating down both arms. He has felt a little more fatigue and shortness of breath associated with these episodes. Also of note, he has a left bundle branch block. Apparently he's had this diagnosis for some time but does not have any recollection of prior workup of this.  He had a remote stress test and cardiac catheterization about 15 years ago which apparently was negative,  But he did not have the left bundle branch block at that time. Family history is consistent with coronary disease and his mother that developed in her 51s.  Mr. Holsopple returns today for hospital follow-up. He underwent cardiac catheterization by myself which showed a 40% proximal to mid LAD lesion however no other significant stenosis. EF however was moderate to severely reduced at about 35-40%. I started him on lisinopril HCTZ and he seems to be doing well with that medication. Blood pressure is adequate today. He reported one episode when he was doing significant work outside that he  was dehydrated and had some leg cramping. He got potassium in his symptoms improve. He had an echocardiogram this morning which I ordered.  I personally reviewed the images and it shows a mild increase in LV function up to an EF of 40-45%. There is global hypokinesis with mild mitral regurgitation. The formal echo read should be out later today. Surprisingly, he continues to have some chest discomfort only when working out on the elliptical machine. 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 His wife noted though that he was a little bit wheezy when he came in. He reports some seasonal allergies which may be a component of this. He's never been a smoker. Echo does not indicate volume overload and his LVEDP during catheterization indicates that he is pretty much euvolemic. I'm still at a loss as a cause of his chest discomfort with exertion.  I saw Mr. Keim back in the office today. He underwent a cardio metabolic test which indicated a flattened VE/V CO2 curve and an exuberant heart rate response to exercise with max predicted heart rate 110%. He did report some shortness of breath and exercise and a small amount of chest discomfort. His performance was described as normal however there was a very mild circulatory limitation based on his perfusion curves. I suspect this may be related to his degree of cardiomyopathy and/or small vessel coronary ischemia based on  his recent cath findings.  Mr. Nestle returns today for follow-up. Overall he seems to be doing fairly well. He has twice had episodes where he felt dizzy and blood pressure was 95 systolic. Overall his blood pressure averages around 120/80. He seems to be tolerating heart failure medicines. He says he still gets some exercise intolerance after about 15-20 minutes of exercise, but then follow that up with saying that certain days he cannot exercise based on  his busy schedule for more than 15 or 20 minutes. I'm not sure that he's necessarily pushing himself enough with exercise. EKG shows a stable wide left bundle branch block with QRS duration 152 ms.  PMHx:  Past Medical History  Diagnosis Date  . Esophageal reflux   . Hiatal hernia   . Barrett's esophagus     Dr Sharlett Iles  . Adenocarcinoma of prostate [facility]) 2008    Dr Jonette Eva  . Esophagitis, unspecified   . Hyperlipemia   . LBBB (left bundle branch block)   . Personal history of colonic polyps 02/22/2010    hyperplastic  . Elevated blood pressure reading without diagnosis of hypertension   . Acute gastritis without mention of hemorrhage   . Stricture and stenosis of esophagus   . Anemia   . Colon cancer ([place]) 10/24/11    [facility]    Past Surgical History  Procedure Laterality Date  . Cardiac catheterization  2004    [facility], negative  . Prostatectomy  2007    Dr Jonette Eva , [facility]  . Inguinal hernia repair  2009  . Upper gastrointestinal endoscopy  2014    Barrett's, stricture X 3, initially 2006  . Colonoscopy with polypectomy      X4; due 2016  . Colon surgery  2014    low grade adenocarcinoma of cecum  . Esophageal dilation      Dr Sharlett Iles  . Cardiac catheterization N/A 06/06/2014    Procedure: Left Heart Cath and Coronary Angiography;  Surgeon: Pixie Casino, MD;  Location: [place] [facility];  Service: Cardiovascular;  Laterality: N/A;    FAMHx:  Family History  Problem Relation Age of Onset  . Diabetes Father   . Prostate cancer Father     in 34s  . Heart attack Father     MI in 76s  . Heart attack Mother     MI in 50s  . Alzheimer's disease Mother   . Lymphoma Maternal Grandmother   . Stroke Neg Hx   . Hypertension Neg Hx   . Colon cancer Brother     SOCHx:   reports that he has never smoked. He has never used smokeless tobacco. He reports that he drinks about 1.8 oz of alcohol per week. He reports that he does not use illicit  drugs.  ALLERGIES:  No Known Allergies  ROS: A comprehensive review of systems was negative.  HOME MEDS: Current Outpatient Prescriptions  Medication Sig Dispense Refill  . Alum Hydroxide-Mag Carbonate (GAVISCON PO) Take by mouth at bedtime as needed.     . Ascorbic Acid (VITAMIN C) 1000 MG tablet Take 1,000 mg by mouth daily.      Marland Kitchen aspirin EC 81 MG tablet Take 81 mg by mouth daily.    . carvedilol (COREG) 6.25 MG tablet Take 1 tablet (6.25 mg total) by mouth 2 (two) times daily. 180 tablet 3  . Cholecalciferol (VITAMIN D-3 PO) Take 1 tablet by mouth daily.    Marland Kitchen co-enzyme Q-10 50 MG  capsule Take 50 mg by mouth daily.    Marland Kitchen Hesperidin 98 % POWD Take 500 mg by mouth daily.     . Linoleic Acid-Sunflower Oil (CLA PO) Take 1,000 mg by mouth daily.    Marland Kitchen lisinopril-hydrochlorothiazide (PRINZIDE,ZESTORETIC) 10-12.5 MG tablet Take 1 tablet by mouth daily. 90 tablet 3  . loratadine (CLARITIN) 10 MG tablet Take 10 mg by mouth as needed.    . Multiple Vitamins-Minerals (CENTRUM SILVER PO) Take 1 capsule by mouth daily.      Marland Kitchen NEXIUM 40 MG capsule take 1 capsule by mouth once daily 30 capsule 2  . NON FORMULARY 2-3 capsules daily. Hemp Oil    . Omega-3 Fatty Acids (FISH OIL) 1200 MG CAPS Take 1 capsule by mouth daily.      . Quercetin (QUERCITIN) POWD Take 500 mg by mouth daily.     . Turmeric Curcumin 500 MG CAPS Take 1 capsule by mouth daily.      Marland Kitchen UNABLE TO FIND Take 1 tablet by mouth daily. Vitamin B 50 complex    . vitamin E (VITAMIN E) 400 UNIT capsule Take 400 Units by mouth daily.       No current facility-administered medications for this visit.    LABS/IMAGING: No results found for this or any previous visit (from the past 48 hour(s)). No results found.  WEIGHTS: Wt Readings from Last 3 Encounters:  11/20/14 180 lb 4.8 oz (81.784 kg)  08/18/14 176 lb 12.8 oz (80.196 kg)  06/16/14 175 lb (79.379 kg)    VITALS: BP 112/70 mmHg  Pulse 75  Ht 5\' 10"  (1.778 m)  Wt 180 lb 4.8  oz (81.784 kg)  BMI 25.87 kg/m2  EXAM: General appearance: alert and no distress Lungs: clear to auscultation bilaterally Heart: regular rate and rhythm, S1, S2 normal, no murmur, click, rub or gallop Extremities: extremities normal, atraumatic, no cyanosis or edema Skin: Skin color, texture, turgor normal. No rashes or lesions Neurologic: Grossly normal  EKG: Sinus rhythm with first-degree AV block at 75, left bundle branch block with QRS duration 152 ms   ASSESSMENT: 1.  Chest pain - at most 40% proximal to mid LAD stenosis 2.  Abnormal EKG with left bundle branch block 3.  Nonischemic cardiomyopathy, EF 40-45% by echo, NYHA Class II symptoms 4.  History of GERD/Barrett's esophagus 5. Small vessel ischemia/mild circulatory limitation on cardio metabolic testing  PLAN: 1.    Mr. Capano is not currently having any anginal symptoms. He does get short of breath with exercise. I've encouraged him to continue to push his exercise more. This should help him ultimately. He seems to be tolerating heart failure medications and we'll go ahead and recheck an echocardiogram next month to see the admitting improvement in LV function. Plan to see him back in 6 months.  Pixie Casino, MD, [facility] Attending Cardiologist [place] [facility] 11/20/2014, 8:36 AM

## 2014-11-20 NOTE — Patient Instructions (Signed)
Your physician has requested that you have an echocardiogram in late November/early December @ [address]. Echocardiography is a painless test that uses sound waves to create images of your heart. It provides your doctor with information about the size and shape of your heart and how well your heart's chambers and valves are working. This procedure takes approximately one hour. There are no restrictions for this procedure.  Your physician recommends that you schedule a follow-up appointment in 6 months with Dr. Debara Pickett.

## 2014-12-11 DIAGNOSIS — K219 Gastro-esophageal reflux disease without esophagitis: Secondary | ICD-10-CM | POA: Diagnosis not present

## 2014-12-11 DIAGNOSIS — Z6825 Body mass index (BMI) 25.0-25.9, adult: Secondary | ICD-10-CM | POA: Diagnosis not present

## 2014-12-11 DIAGNOSIS — Z85038 Personal history of other malignant neoplasm of large intestine: Secondary | ICD-10-CM | POA: Diagnosis not present

## 2014-12-11 DIAGNOSIS — R05 Cough: Secondary | ICD-10-CM | POA: Diagnosis not present

## 2014-12-26 ENCOUNTER — Ambulatory Visit (HOSPITAL_COMMUNITY): Payer: Medicare Other | Attending: Cardiovascular Disease

## 2014-12-26 ENCOUNTER — Other Ambulatory Visit: Payer: Self-pay

## 2014-12-26 ENCOUNTER — Other Ambulatory Visit: Payer: Self-pay | Admitting: Internal Medicine

## 2014-12-26 DIAGNOSIS — I429 Cardiomyopathy, unspecified: Secondary | ICD-10-CM | POA: Insufficient documentation

## 2014-12-26 DIAGNOSIS — I428 Other cardiomyopathies: Secondary | ICD-10-CM

## 2014-12-26 DIAGNOSIS — Z79899 Other long term (current) drug therapy: Secondary | ICD-10-CM | POA: Diagnosis not present

## 2014-12-26 DIAGNOSIS — E785 Hyperlipidemia, unspecified: Secondary | ICD-10-CM | POA: Insufficient documentation

## 2014-12-26 DIAGNOSIS — I517 Cardiomegaly: Secondary | ICD-10-CM | POA: Diagnosis not present

## 2015-02-25 DIAGNOSIS — C61 Malignant neoplasm of prostate: Secondary | ICD-10-CM | POA: Diagnosis not present

## 2015-03-04 DIAGNOSIS — Z6825 Body mass index (BMI) 25.0-25.9, adult: Secondary | ICD-10-CM | POA: Diagnosis not present

## 2015-03-04 DIAGNOSIS — C61 Malignant neoplasm of prostate: Secondary | ICD-10-CM | POA: Diagnosis not present

## 2015-03-04 DIAGNOSIS — N529 Male erectile dysfunction, unspecified: Secondary | ICD-10-CM | POA: Diagnosis not present

## 2015-05-08 DIAGNOSIS — Z23 Encounter for immunization: Secondary | ICD-10-CM | POA: Diagnosis not present

## 2015-05-25 ENCOUNTER — Encounter: Payer: Self-pay | Admitting: Internal Medicine

## 2015-06-01 DIAGNOSIS — Z85038 Personal history of other malignant neoplasm of large intestine: Secondary | ICD-10-CM | POA: Diagnosis not present

## 2015-07-24 ENCOUNTER — Encounter: Payer: Self-pay | Admitting: Internal Medicine

## 2015-08-19 DIAGNOSIS — Z980 Intestinal bypass and anastomosis status: Secondary | ICD-10-CM | POA: Diagnosis not present

## 2015-08-19 DIAGNOSIS — K5730 Diverticulosis of large intestine without perforation or abscess without bleeding: Secondary | ICD-10-CM | POA: Diagnosis not present

## 2015-08-19 DIAGNOSIS — Z85038 Personal history of other malignant neoplasm of large intestine: Secondary | ICD-10-CM | POA: Diagnosis not present

## 2015-08-19 DIAGNOSIS — Z1509 Genetic susceptibility to other malignant neoplasm: Secondary | ICD-10-CM | POA: Diagnosis not present

## 2015-09-16 DIAGNOSIS — H5315 Visual distortions of shape and size: Secondary | ICD-10-CM | POA: Diagnosis not present

## 2015-10-02 DIAGNOSIS — L814 Other melanin hyperpigmentation: Secondary | ICD-10-CM | POA: Diagnosis not present

## 2015-10-02 DIAGNOSIS — D1801 Hemangioma of skin and subcutaneous tissue: Secondary | ICD-10-CM | POA: Diagnosis not present

## 2015-10-02 DIAGNOSIS — L578 Other skin changes due to chronic exposure to nonionizing radiation: Secondary | ICD-10-CM | POA: Diagnosis not present

## 2015-10-02 DIAGNOSIS — L820 Inflamed seborrheic keratosis: Secondary | ICD-10-CM | POA: Diagnosis not present

## 2015-10-02 DIAGNOSIS — L821 Other seborrheic keratosis: Secondary | ICD-10-CM | POA: Diagnosis not present

## 2015-10-21 DIAGNOSIS — Z23 Encounter for immunization: Secondary | ICD-10-CM | POA: Diagnosis not present

## 2015-12-01 ENCOUNTER — Other Ambulatory Visit: Payer: Self-pay | Admitting: Internal Medicine

## 2015-12-02 NOTE — Telephone Encounter (Signed)
Rx(s) sent to pharmacy electronically.  

## 2016-01-01 ENCOUNTER — Ambulatory Visit: Payer: Medicare Other | Admitting: Internal Medicine

## 2016-01-05 ENCOUNTER — Ambulatory Visit (INDEPENDENT_AMBULATORY_CARE_PROVIDER_SITE_OTHER): Payer: Medicare Other | Admitting: Internal Medicine

## 2016-01-05 VITALS — BP 114/66 | HR 79 | Ht 70.0 in | Wt 182.0 lb

## 2016-01-05 DIAGNOSIS — E785 Hyperlipidemia, unspecified: Secondary | ICD-10-CM

## 2016-01-05 DIAGNOSIS — I10 Essential (primary) hypertension: Secondary | ICD-10-CM

## 2016-01-05 DIAGNOSIS — I447 Left bundle-branch block, unspecified: Secondary | ICD-10-CM | POA: Diagnosis not present

## 2016-01-05 DIAGNOSIS — I428 Other cardiomyopathies: Secondary | ICD-10-CM

## 2016-01-05 MED ORDER — LISINOPRIL-HYDROCHLOROTHIAZIDE 10-12.5 MG PO TABS: 1.0000 | ORAL_TABLET | Freq: Every day | ORAL | 3 refills | Status: DC

## 2016-01-05 MED ORDER — CARVEDILOL 6.25 MG PO TABS: 6.2500 mg | ORAL_TABLET | Freq: Two times a day (BID) | ORAL | 3 refills | Status: DC

## 2016-01-05 NOTE — Patient Instructions (Signed)
Your physician recommends that you return for lab work FASTING  Your physician wants you to follow-up in: ONE YEAR with Dr. Hilty. You will receive a reminder letter in the mail two months in advance. If you don't receive a letter, please call our office to schedule the follow-up appointment.   

## 2016-01-05 NOTE — Progress Notes (Signed)
OFFICE NOTE  Chief Complaint:  Routine follow-up  Primary Care Physician: Unice Cobble, MD  HPI:  Steven Hawkins  Is a pleasant 70 -year-old male who is coming referred to me for evaluation of chest pain. 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 He says that he's been under a lot of stress recently but continues to exercise. He exercises almost every day and recently he's had some problems with exercise on an elliptical machine where he develops pressure across the top of his chest and into the first part of both arms.  The symptoms occur after aerobic exercise and are relieved at rest. He has no symptoms with weight lifting.  He started to take a break from this and was advised to walk on a treadmill. He finds that if he is able to walk on the treadmill at a slow pace his symptoms are not noticeable, but if he increases the rate or incline he starts to have typical pressure across his chest again radiating down both arms. He has felt a little more fatigue and shortness of breath associated with these episodes. Also of note, he has a left bundle branch block. Apparently he's had this diagnosis for some time but does not have any recollection of prior workup of this.  He had a remote stress test and cardiac catheterization about 15 years ago which apparently was negative,  But he did not have the left bundle branch block at that time. Family history is consistent with coronary disease and his mother that developed in her 4s.  [name] returns today for hospital follow-up. He underwent cardiac catheterization by myself which showed a 40% proximal to mid LAD lesion however no other significant stenosis. EF however was moderate to severely reduced at about 35-40%. I started him on lisinopril HCTZ and he seems to be doing well with that medication. Blood pressure is adequate today. He reported one episode when he was doing significant work outside that he  was dehydrated and had some leg cramping. He got potassium in his symptoms improve. He had an echocardiogram this morning which I ordered.  I personally reviewed the images and it shows a mild increase in LV function up to an EF of 40-45%. There is global hypokinesis with mild mitral regurgitation. The formal echo read should be out later today. Surprisingly, he continues to have some chest discomfort only when working out on the elliptical machine. 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 His wife noted though that he was a little bit wheezy when he came in. He reports some seasonal allergies which may be a component of this. He's never been a smoker. Echo does not indicate volume overload and his LVEDP during catheterization indicates that he is pretty much euvolemic. I'm still at a loss as a cause of his chest discomfort with exertion.  I saw [name] back in the office today. He underwent a cardio metabolic test which indicated a flattened VE/V CO2 curve and an exuberant heart rate response to exercise with max predicted heart rate 110%. He did report some shortness of breath and exercise and a small amount of chest discomfort. His performance was described as normal however there was a very mild circulatory limitation based on his perfusion curves. I suspect this may be related to his degree of cardiomyopathy and/or small vessel coronary ischemia based on  his recent cath findings.  [name] returns today for follow-up. Overall he seems to be doing fairly well. He has twice had episodes where he felt dizzy and blood pressure was 95 systolic. Overall his blood pressure averages around 120/80. He seems to be tolerating heart failure medicines. He says he still gets some exercise intolerance after about 15-20 minutes of exercise, but then follow that up with saying that certain days he cannot exercise based on  his busy schedule for more than 15 or 20 minutes. I'm not sure that he's necessarily pushing himself enough with exercise. EKG shows a stable wide left bundle branch block with QRS duration 152 ms.  01/05/2016  [name] returns today for follow-up. Over the past year he's done well. He denies any worsening shortness of breath. He's been stable with his exercise. He denies any chest pain. EKG shows a stable left bundle branch block. His last echo in December 2016 showed his EF was 45-50%. He has NYHA class I symptoms. He is overdue for a lipid profile. His last cholesterol showed an LDL of 130 with particle number of 1900. He is not currently on any cholesterol medication. He is also seen a physician for "antiaging medicine". He is on some over-the-counter testosterone and thyroid medicine.  PMHx:  Past Medical History:  Diagnosis Date  . Acute gastritis without mention of hemorrhage   . 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 Anemia   . Barrett's esophagus    Dr Sharlett Iles  . Colon cancer ([place]) 10/24/11   [facility]  . Elevated blood pressure reading without diagnosis of hypertension   . Esophageal reflux   . Esophagitis, unspecified   . Hiatal hernia   . Hyperlipemia   . LBBB (left bundle branch block)   . Personal history of colonic polyps 02/22/2010   hyperplastic  . Stricture and stenosis of esophagus     Past Surgical History:  Procedure Laterality Date  . CARDIAC CATHETERIZATION  2004   [facility], negative  . CARDIAC CATHETERIZATION N/A 06/06/2014   Procedure: Left Heart Cath and Coronary Angiography;  Surgeon: Pixie Casino, MD;  Location: [place] [facility];  Service: Cardiovascular;  Laterality: N/A;  . COLON SURGERY  2014   low grade adenocarcinoma of cecum  . colonoscopy with polypectomy     X4; due 2016  . ESOPHAGEAL DILATION     Dr Sharlett Iles  . INGUINAL HERNIA REPAIR  2009  . PROSTATECTOMY  2007   Dr Jonette Eva , [facility]  . UPPER  GASTROINTESTINAL ENDOSCOPY  2014   Barrett's, stricture X 3, initially 2006    FAMHx:  Family History  Problem Relation Age of Onset  . Diabetes Father   . Prostate cancer Father     in 62s  . Heart attack Father     MI in 65s  . Heart attack Mother     MI in 60s  . Alzheimer's disease Mother   . Lymphoma Maternal Grandmother   . Stroke Neg Hx   . Hypertension Neg Hx   . Colon cancer Brother     SOCHx:   reports that he has never smoked. He has never used smokeless tobacco. He reports that he drinks about 1.8 oz of alcohol per week . He reports that he does not use drugs.  ALLERGIES:  No Known Allergies  ROS: Pertinent items noted in HPI and remainder of comprehensive ROS otherwise negative.  HOME MEDS: Current Outpatient  Prescriptions  Medication Sig Dispense Refill  . Alum Hydroxide-Mag Carbonate (GAVISCON PO) Take by mouth at bedtime as needed.     . Ascorbic Acid (VITAMIN C) 1000 MG tablet Take 1,000 mg by mouth daily.      Marland Kitchen aspirin EC 81 MG tablet Take 81 mg by mouth daily.    . carvedilol (COREG) 6.25 MG tablet Take 1 tablet (6.25 mg total) by mouth 2 (two) times daily. PLEASE CONTACT OFFICE FOR ADDITIONAL REFILLS 1ST ATTEMPT 60 tablet 0  . Cholecalciferol (VITAMIN D-3 PO) Take 1 tablet by mouth daily.    Marland Kitchen co-enzyme Q-10 50 MG capsule Take 50 mg by mouth daily.    Marland Kitchen Hesperidin 98 % POWD Take 500 mg by mouth daily.     . Linoleic Acid-Sunflower Oil (CLA PO) Take 1,000 mg by mouth daily.    Marland Kitchen lisinopril-hydrochlorothiazide (PRINZIDE,ZESTORETIC) 10-12.5 MG tablet Take 1 tablet by mouth daily. PLEASE CONTACT OFFICE FOR ADDITIONAL REFILLS 1ST ATTEPMT 30 tablet 0  . loratadine (CLARITIN) 10 MG tablet Take 10 mg by mouth as needed.    . Multiple Vitamins-Minerals (CENTRUM SILVER PO) Take 1 capsule by mouth daily.      Marland Kitchen NEXIUM 40 MG capsule take 1 capsule by mouth once daily 30 capsule 2  . NON FORMULARY 2-3 capsules daily. Hemp Oil    . NP THYROID 30 MG tablet TK 1 T  PO QAM  0  . Omega-3 Fatty Acids (FISH OIL) 1200 MG CAPS Take 1 capsule by mouth daily.      . Quercetin (QUERCITIN) POWD Take 500 mg by mouth daily.     . Turmeric Curcumin 500 MG CAPS Take 1 capsule by mouth daily.      Marland Kitchen UNABLE TO FIND Take 1 tablet by mouth daily. Vitamin B 50 complex    . vitamin E (VITAMIN E) 400 UNIT capsule Take 400 Units by mouth daily.       No current facility-administered medications for this visit.     LABS/IMAGING: No results found for this or any previous visit (from the past 48 hour(s)). No results found.  WEIGHTS: Wt Readings from Last 3 Encounters:  01/05/16 182 lb (82.6 kg)  11/20/14 180 lb 4.8 oz (81.8 kg)  08/18/14 176 lb 12.8 oz (80.2 kg)    VITALS: BP 114/66   Pulse 79   Ht 5\' 10"  (1.778 m)   Wt 182 lb (82.6 kg)   BMI 26.11 kg/m   EXAM: General appearance: alert and no distress Lungs: clear to auscultation bilaterally Heart: regular rate and rhythm, S1, S2 normal, no murmur, click, rub or gallop Extremities: extremities normal, atraumatic, no cyanosis or edema Skin: Skin color, texture, turgor normal. No rashes or lesions Neurologic: Grossly normal  EKG: Sinus rhythm at 79, left bundle branch block with QRS duration 148 ms   ASSESSMENT: 1.  Chest pain - at most 40% proximal to mid LAD stenosis 2.  Abnormal EKG with left bundle branch block 3.  Nonischemic cardiomyopathy, EF 45-50% by echo (2016), NYHA Class I symptoms 4.  History of GERD/Barrett's esophagus 5. Small vessel ischemia/mild circulatory limitation on cardio metabolic testing  PLAN: 1.    Mr. Sepe is feeling well with NYHA class I symptoms. He continues to exercise without limitations. He is on appropriate medical therapy for his cardiomyopathy which is likely related to his left bundle branch block. He is due for repeat lipid profile given his history of mild coronary artery disease. He will likely need  to be on therapy for that.  Follow-up annually or sooner as  necessary.  Pixie Casino, MD, [facility] Attending Cardiologist [facility] 01/05/2016, 8:09 AM

## 2016-01-06 DIAGNOSIS — E785 Hyperlipidemia, unspecified: Secondary | ICD-10-CM | POA: Diagnosis not present

## 2016-01-06 DIAGNOSIS — Z0000 Encounter for general adult medical examination without abnormal findings: Secondary | ICD-10-CM | POA: Diagnosis not present

## 2016-01-07 ENCOUNTER — Ambulatory Visit: Payer: Medicare Other | Admitting: Internal Medicine

## 2016-01-07 LAB — NMR, LIPOPROFILE
Cholesterol: 171 mg/dL (ref 100–199)
HDL Cholesterol by NMR: 41 mg/dL (ref >39)
HDL PARTICLE NUMBER: 27.5 umol/L — AB (ref >=30.5)
LDL PARTICLE NUMBER: 1435 nmol/L — AB (ref <1000)
LDL Size: 20.3 nm (ref >20.5)
LDL-C: 112 mg/dL — AB (ref 0–99)
LP-IR SCORE: 60 — AB (ref <=45)
Small LDL Particle Number: 801 nmol/L — ABNORMAL HIGH (ref <=527)
TRIGLYCERIDES BY NMR: 88 mg/dL (ref 0–149)

## 2016-01-08 ENCOUNTER — Telehealth: Payer: Self-pay | Admitting: Internal Medicine

## 2016-01-08 DIAGNOSIS — E785 Hyperlipidemia, unspecified: Secondary | ICD-10-CM

## 2016-01-08 MED ORDER — ATORVASTATIN CALCIUM 40 MG PO TABS: 40.0000 mg | ORAL_TABLET | Freq: Every day | ORAL | 3 refills | Status: DC

## 2016-01-08 NOTE — Telephone Encounter (Signed)
Atorvastatin & NMR ([facility]) ordered per lab results released to [place]. Lab slips mailed to patient.

## 2016-02-25 DIAGNOSIS — C61 Malignant neoplasm of prostate: Secondary | ICD-10-CM | POA: Diagnosis not present

## 2016-03-03 DIAGNOSIS — C61 Malignant neoplasm of prostate: Secondary | ICD-10-CM | POA: Diagnosis not present

## 2016-03-03 DIAGNOSIS — Z6825 Body mass index (BMI) 25.0-25.9, adult: Secondary | ICD-10-CM | POA: Diagnosis not present

## 2016-03-03 DIAGNOSIS — N529 Male erectile dysfunction, unspecified: Secondary | ICD-10-CM | POA: Diagnosis not present

## 2016-03-10 DIAGNOSIS — N529 Male erectile dysfunction, unspecified: Secondary | ICD-10-CM | POA: Diagnosis not present

## 2016-03-10 DIAGNOSIS — C61 Malignant neoplasm of prostate: Secondary | ICD-10-CM | POA: Diagnosis not present

## 2016-03-24 ENCOUNTER — Ambulatory Visit (INDEPENDENT_AMBULATORY_CARE_PROVIDER_SITE_OTHER): Payer: Medicare Other | Admitting: Family

## 2016-03-24 ENCOUNTER — Encounter: Payer: Self-pay | Admitting: Family

## 2016-03-24 ENCOUNTER — Other Ambulatory Visit (INDEPENDENT_AMBULATORY_CARE_PROVIDER_SITE_OTHER): Payer: Medicare Other

## 2016-03-24 VITALS — BP 122/80 | HR 77 | Temp 98.6°F | Resp 16 | Ht 70.0 in | Wt 180.0 lb

## 2016-03-24 DIAGNOSIS — D649 Anemia, unspecified: Secondary | ICD-10-CM | POA: Insufficient documentation

## 2016-03-24 DIAGNOSIS — Z7289 Other problems related to lifestyle: Secondary | ICD-10-CM

## 2016-03-24 LAB — IBC PANEL
Iron: 22 ug/dL — ABNORMAL LOW (ref 42–165)
Saturation Ratios: 3.9 % — ABNORMAL LOW (ref 20.0–50.0)
TRANSFERRIN: 399 mg/dL — AB (ref 212.0–360.0)

## 2016-03-24 LAB — CBC
HCT: 43.4 % (ref 39.0–52.0)
Hemoglobin: 13.6 g/dL (ref 13.0–17.0)
MCHC: 31.3 g/dL (ref 30.0–36.0)
MCV: 71.1 fl — AB (ref 78.0–100.0)
Platelets: 345 10*3/uL (ref 150.0–400.0)
RBC: 6.1 Mil/uL — AB (ref 4.22–5.81)
RDW: 18.5 % — ABNORMAL HIGH (ref 11.5–15.5)
WBC: 11.5 10*3/uL — ABNORMAL HIGH (ref 4.0–10.5)

## 2016-03-24 LAB — HEPATITIS C ANTIBODY: HCV Ab: NEGATIVE

## 2016-03-24 NOTE — Progress Notes (Signed)
Subjective:    Patient ID: Steven Hawkins, male    DOB: November 26, 1945, 71 y.o.   MRN: [id number]  Chief Complaint  Patient presents with  . Establish Care    tried to give blood and had low hemoglobin, last time it was low he had colon cancer, fecal accult test, hep C test    HP  Steven Hawkins is a 71 y.o. male who  has a past medical history of Acute gastritis without mention of hemorrhage; Adenocarcinoma of prostate ([place]) (2008); Anemia; Barrett's esophagus; Colon cancer ([place]) (10/24/11); Elevated blood pressure reading without diagnosis of hypertension; Esophageal reflux; Esophagitis, unspecified; Hiatal hernia; Hyperlipemia; Hypertension; LBBB (left bundle branch block); Personal history of colonic polyps (02/22/2010); and Stricture and stenosis of esophagus. and presents today for an office visit to establish care.   Care Team:  Jonette Eva, MD - Urologist Lyman Bishop, MD - Cardiology Alois Cliche, MD - Oncology  Decreased hemoglobin - Recently attempted to donate blood and was noted to have a hemoglobin of 12.5. Notes last time that he experienced a low hemoglobin he was noted to have colon cancer. Most recent colonoscopy completed in July and was negative. Denies any dizziness, weakness or bleeding that he is aware of.   Immunization History  Administered Date(s) Administered  . Influenza Whole 11/09/2007  . Influenza-Unspecified 10/24/2013, 10/22/2014  . Pneumococcal Conjugate-13 12/15/2013  . Pneumococcal Polysaccharide-23 04/25/2015  . Tdap 11/11/2010  . Zoster 06/02/2008, 09/20/2010    No Known Allergies    Outpatient Medications Prior to Visit  Medication Sig Dispense Refill  . Alum Hydroxide-Mag Carbonate (GAVISCON PO) Take by mouth at bedtime as needed.     . Ascorbic Acid (VITAMIN C) 1000 MG tablet Take 1,000 mg by mouth daily.      Marland Kitchen aspirin EC 81 MG tablet Take 81 mg by mouth daily.    Marland Kitchen atorvastatin (LIPITOR) 40 MG tablet Take 1 tablet (40 mg total) by mouth  daily. 90 tablet 3  . carvedilol (COREG) 6.25 MG tablet Take 1 tablet (6.25 mg total) by mouth 2 (two) times daily. 180 tablet 3  . Cholecalciferol (VITAMIN D-3 PO) Take 1 tablet by mouth daily.    Marland Kitchen co-enzyme Q-10 50 MG capsule Take 50 mg by mouth daily.    Marland Kitchen DHEA 50 MG CAPS Take 50 mg by mouth daily.    Marland Kitchen Hesperidin 98 % POWD Take 500 mg by mouth daily.     . Linoleic Acid-Sunflower Oil (CLA PO) Take 1,000 mg by mouth daily.    Marland Kitchen lisinopril-hydrochlorothiazide (PRINZIDE,ZESTORETIC) 10-12.5 MG tablet Take 1 tablet by mouth daily. 90 tablet 3  . loratadine (CLARITIN) 10 MG tablet Take 10 mg by mouth as needed.    . Multiple Vitamins-Minerals (CENTRUM SILVER PO) Take 1 capsule by mouth daily.      Marland Kitchen NEXIUM 40 MG capsule take 1 capsule by mouth once daily 30 capsule 2  . NP THYROID 30 MG tablet TK 1 T PO QAM  0  . Omega-3 Fatty Acids (FISH OIL) 1200 MG CAPS Take 1 capsule by mouth daily.      . Quercetin (QUERCITIN) POWD Take 500 mg by mouth daily.     . Testosterone Cypionate POWD Take 100 capsules by mouth daily.    . Turmeric Curcumin 500 MG CAPS Take 1 capsule by mouth daily.      Marland Kitchen UNABLE TO FIND Take 1 tablet by mouth daily. Vitamin B 50 complex    . vitamin  E (VITAMIN E) 400 UNIT capsule Take 400 Units by mouth daily.      . NON FORMULARY 2-3 capsules daily. Hemp Oil     No facility-administered medications prior to visit.      Past Medical History:  Diagnosis Date  . Acute gastritis without mention of hemorrhage   . 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 Anemia   . Barrett's esophagus    Dr Sharlett Iles  . Colon cancer ([place]) 10/24/11   [facility]  . Elevated blood pressure reading without diagnosis of hypertension   . Esophageal reflux   . Esophagitis, unspecified   . Hiatal hernia   . Hyperlipemia   . Hypertension   . LBBB (left bundle branch block)   . Personal history of colonic polyps 02/22/2010   hyperplastic  . Stricture and stenosis of esophagus        Past Surgical History:  Procedure Laterality Date  . CARDIAC CATHETERIZATION  2004   [facility], negative  . CARDIAC CATHETERIZATION N/A 06/06/2014   Procedure: Left Heart Cath and Coronary Angiography;  Surgeon: Pixie Casino, MD;  Location: [place] [facility];  Service: Cardiovascular;  Laterality: N/A;  . COLON SURGERY  2014   low grade adenocarcinoma of cecum  . colonoscopy with polypectomy     X4; due 2016  . ESOPHAGEAL DILATION     Dr Sharlett Iles  . INGUINAL HERNIA REPAIR  2009  . PROSTATECTOMY  2007   Dr Jonette Eva , [facility]  . UPPER GASTROINTESTINAL ENDOSCOPY  2014   Barrett's, stricture X 3, initially 2006     Review of Systems  Constitutional: Negative for chills and fever.  Respiratory: Negative for chest tightness, shortness of breath and wheezing.   Cardiovascular: Negative for chest pain, palpitations and leg swelling.  Neurological: Negative for dizziness, seizures, syncope, weakness and light-headedness.       Objective:    BP 122/80 (BP Location: Left Arm, Patient Position: Sitting, Cuff Size: Normal)   Pulse 77   Temp 98.6 F (37 C) (Oral)   Resp 16   Ht 5\' 10"  (1.778 m)   Wt 180 lb (81.6 kg)   SpO2 97%   BMI 25.83 kg/m  Nursing note and vital signs reviewed.  Physical Exam  Constitutional: He is oriented to person, place, and time. He appears well-developed and well-nourished. No distress.  Cardiovascular: Normal rate, regular rhythm, normal heart sounds and intact distal pulses.  Exam reveals no gallop and no friction rub.   No murmur heard. Pulmonary/Chest: Effort normal and breath sounds normal. No respiratory distress. He has no wheezes. He has no rales. He exhibits no tenderness.  Neurological: He is alert and oriented to person, place, and time.  Skin: Skin is warm and dry. No pallor.  Psychiatric: He has a normal mood and affect. His behavior is normal. Judgment and thought content normal.        Assessment & Plan:    Problem List Items Addressed This Visit      Other   Anemia - Primary    Previously diagnosed with anemia with most recent episode of mildly low hemoglobin noted during attempted blood donation. Denies any current bleeding. Obtain fecal occult blood test. There is concern for iron deficiency. Obtain IBC panel, and CBC. Continue to monitor pending blood work results.      Relevant Orders   CBC (Completed)   IBC panel (Completed)   Fecal occult blood, imunochemical  Other Visit Diagnoses    Other problems related to lifestyle       Relevant Orders   Hepatitis C antibody (Completed)       I have discontinued Mr. Gipple NON FORMULARY. I am also having him maintain his Multiple Vitamins-Minerals (CENTRUM SILVER PO), Fish Oil, Hesperidin, Quercitin, Turmeric Curcumin, vitamin C, vitamin E, Cholecalciferol (VITAMIN D-3 PO), loratadine, Alum Hydroxide-Mag Carbonate (GAVISCON PO), NEXIUM, UNABLE TO FIND, Linoleic Acid-Sunflower Oil (CLA PO), aspirin EC, co-enzyme Q-10, NP THYROID, DHEA, Testosterone Cypionate, lisinopril-hydrochlorothiazide, carvedilol, atorvastatin, and Niacinamide-Zinc-Copper-FA (NICOTINAMIDE ZCF PO).   Meds ordered this encounter  Medications  . Niacinamide-Zinc-Copper-FA (NICOTINAMIDE ZCF PO)    Sig: Take 250 mg by mouth.     Follow-up: Return if symptoms worsen or fail to improve.  Mauricio Po, FNP

## 2016-03-24 NOTE — Patient Instructions (Signed)
Thank you for choosing [facility].  SUMMARY AND INSTRUCTIONS:  Nice to meet you.  We will follow up pending blood work results.   Medication:  Please continue to take your medications / vitamins as prescribed.    Labs:  Please stop by the lab on the lower level of the building for your blood work. Your results will be released to [place] (or called to you) after review, usually within 72 hours after test completion. If any changes need to be made, you will be notified at that same time.  1.) The lab is open from 7:30am to 5:30 pm Monday-Friday 2.) No appointment is necessary 3.) Fasting (if needed) is 6-8 hours after food and drink; black coffee and water are okay   Follow up:  If your symptoms worsen or fail to improve, please contact our office for further instruction, or in case of emergency go directly to the emergency room at the closest medical facility.   Anemia, Nonspecific Anemia is a condition in which the concentration of red blood cells or hemoglobin in the blood is below normal. Hemoglobin is a substance in red blood cells that carries oxygen to the tissues of the body. Anemia results in not enough oxygen reaching these tissues. What are the causes? Common causes of anemia include:  Excessive bleeding. Bleeding may be internal or external. This includes excessive bleeding from periods (in women) or from the intestine.  Poor nutrition.  Chronic kidney, thyroid, and liver disease.  Bone marrow disorders that decrease red blood cell production.  Cancer and treatments for cancer.  HIV, AIDS, and their treatments.  Spleen problems that increase red blood cell destruction.  Blood disorders.  Excess destruction of red blood cells due to infection, medicines, and autoimmune disorders. What are the signs or symptoms?  Minor weakness.  Dizziness.  Headache.  Palpitations.  Shortness of breath, especially with exercise.  Paleness.  Cold  sensitivity.  Indigestion.  Nausea.  Difficulty sleeping.  Difficulty concentrating. Symptoms may occur suddenly or they may develop slowly. How is this diagnosed? Additional blood tests are often needed. These help your health care provider determine the best treatment. Your health care provider will check your stool for blood and look for other causes of blood loss. How is this treated? Treatment varies depending on the cause of the anemia. Treatment can include:  Supplements of iron, vitamin 123456, or folic acid.  Hormone medicines.  A blood transfusion. This may be needed if blood loss is severe.  Hospitalization. This may be needed if there is significant continual blood loss.  Dietary changes.  Spleen removal. Follow these instructions at home: Keep all follow-up appointments. It often takes many weeks to correct anemia, and having your health care provider check on your condition and your response to treatment is very important. Get help right away if:  You develop extreme weakness, shortness of breath, or chest pain.  You become dizzy or have trouble concentrating.  You develop heavy vaginal bleeding.  You develop a rash.  You have bloody or black, tarry stools.  You faint.  You vomit up blood.  You vomit repeatedly.  You have abdominal pain.  You have a fever or persistent symptoms for more than 2-3 days.  You have a fever and your symptoms suddenly get worse.  You are dehydrated. This information is not intended to replace advice given to you by your health care provider. Make sure you discuss any questions you have with your health care provider. Document  Released: 02/18/2004 Document Revised: 06/24/2015 Document Reviewed: 07/06/2012 Elsevier Interactive Patient Education  2017 [employer].

## 2016-03-24 NOTE — Assessment & Plan Note (Signed)
Previously diagnosed with anemia with most recent episode of mildly low hemoglobin noted during attempted blood donation. Denies any current bleeding. Obtain fecal occult blood test. There is concern for iron deficiency. Obtain IBC panel, and CBC. Continue to monitor pending blood work results.

## 2016-03-30 ENCOUNTER — Encounter: Payer: Self-pay | Admitting: Family

## 2016-03-30 ENCOUNTER — Other Ambulatory Visit (INDEPENDENT_AMBULATORY_CARE_PROVIDER_SITE_OTHER): Payer: Medicare Other

## 2016-03-30 DIAGNOSIS — D649 Anemia, unspecified: Secondary | ICD-10-CM

## 2016-03-30 LAB — FECAL OCCULT BLOOD, IMMUNOCHEMICAL: FECAL OCCULT BLD: NEGATIVE

## 2016-06-06 DIAGNOSIS — Z85038 Personal history of other malignant neoplasm of large intestine: Secondary | ICD-10-CM | POA: Diagnosis not present

## 2016-08-12 DIAGNOSIS — Z980 Intestinal bypass and anastomosis status: Secondary | ICD-10-CM | POA: Diagnosis not present

## 2016-08-12 DIAGNOSIS — Z1211 Encounter for screening for malignant neoplasm of colon: Secondary | ICD-10-CM | POA: Diagnosis not present

## 2016-08-12 DIAGNOSIS — Z8719 Personal history of other diseases of the digestive system: Secondary | ICD-10-CM | POA: Diagnosis not present

## 2016-08-12 DIAGNOSIS — K6389 Other specified diseases of intestine: Secondary | ICD-10-CM | POA: Diagnosis not present

## 2016-08-12 DIAGNOSIS — Z7982 Long term (current) use of aspirin: Secondary | ICD-10-CM | POA: Diagnosis not present

## 2016-08-12 DIAGNOSIS — K219 Gastro-esophageal reflux disease without esophagitis: Secondary | ICD-10-CM | POA: Diagnosis not present

## 2016-08-12 DIAGNOSIS — Z8546 Personal history of malignant neoplasm of prostate: Secondary | ICD-10-CM | POA: Diagnosis not present

## 2016-08-12 DIAGNOSIS — Z79899 Other long term (current) drug therapy: Secondary | ICD-10-CM | POA: Diagnosis not present

## 2016-08-12 DIAGNOSIS — K5730 Diverticulosis of large intestine without perforation or abscess without bleeding: Secondary | ICD-10-CM | POA: Diagnosis not present

## 2016-08-12 DIAGNOSIS — D123 Benign neoplasm of transverse colon: Secondary | ICD-10-CM | POA: Diagnosis not present

## 2016-08-12 DIAGNOSIS — Z1509 Genetic susceptibility to other malignant neoplasm: Secondary | ICD-10-CM | POA: Diagnosis not present

## 2016-08-12 DIAGNOSIS — Z85038 Personal history of other malignant neoplasm of large intestine: Secondary | ICD-10-CM | POA: Diagnosis not present

## 2016-08-12 DIAGNOSIS — K640 First degree hemorrhoids: Secondary | ICD-10-CM | POA: Diagnosis not present

## 2016-08-22 DIAGNOSIS — Z85038 Personal history of other malignant neoplasm of large intestine: Secondary | ICD-10-CM | POA: Diagnosis not present

## 2016-08-22 DIAGNOSIS — R970 Elevated carcinoembryonic antigen [CEA]: Secondary | ICD-10-CM | POA: Diagnosis not present

## 2016-09-23 DIAGNOSIS — M25521 Pain in right elbow: Secondary | ICD-10-CM | POA: Diagnosis not present

## 2016-09-23 DIAGNOSIS — Z6825 Body mass index (BMI) 25.0-25.9, adult: Secondary | ICD-10-CM | POA: Diagnosis not present

## 2016-09-23 DIAGNOSIS — M7701 Medial epicondylitis, right elbow: Secondary | ICD-10-CM | POA: Diagnosis not present

## 2016-10-01 IMAGING — CR DG CHEST 2V
2 series · 2 of 2 positions shown · non-contrast
Comparison: None.

CLINICAL DATA: Chest pain.

EXAM:
CHEST  2 VIEW

[view not recorded (1 of 2)]
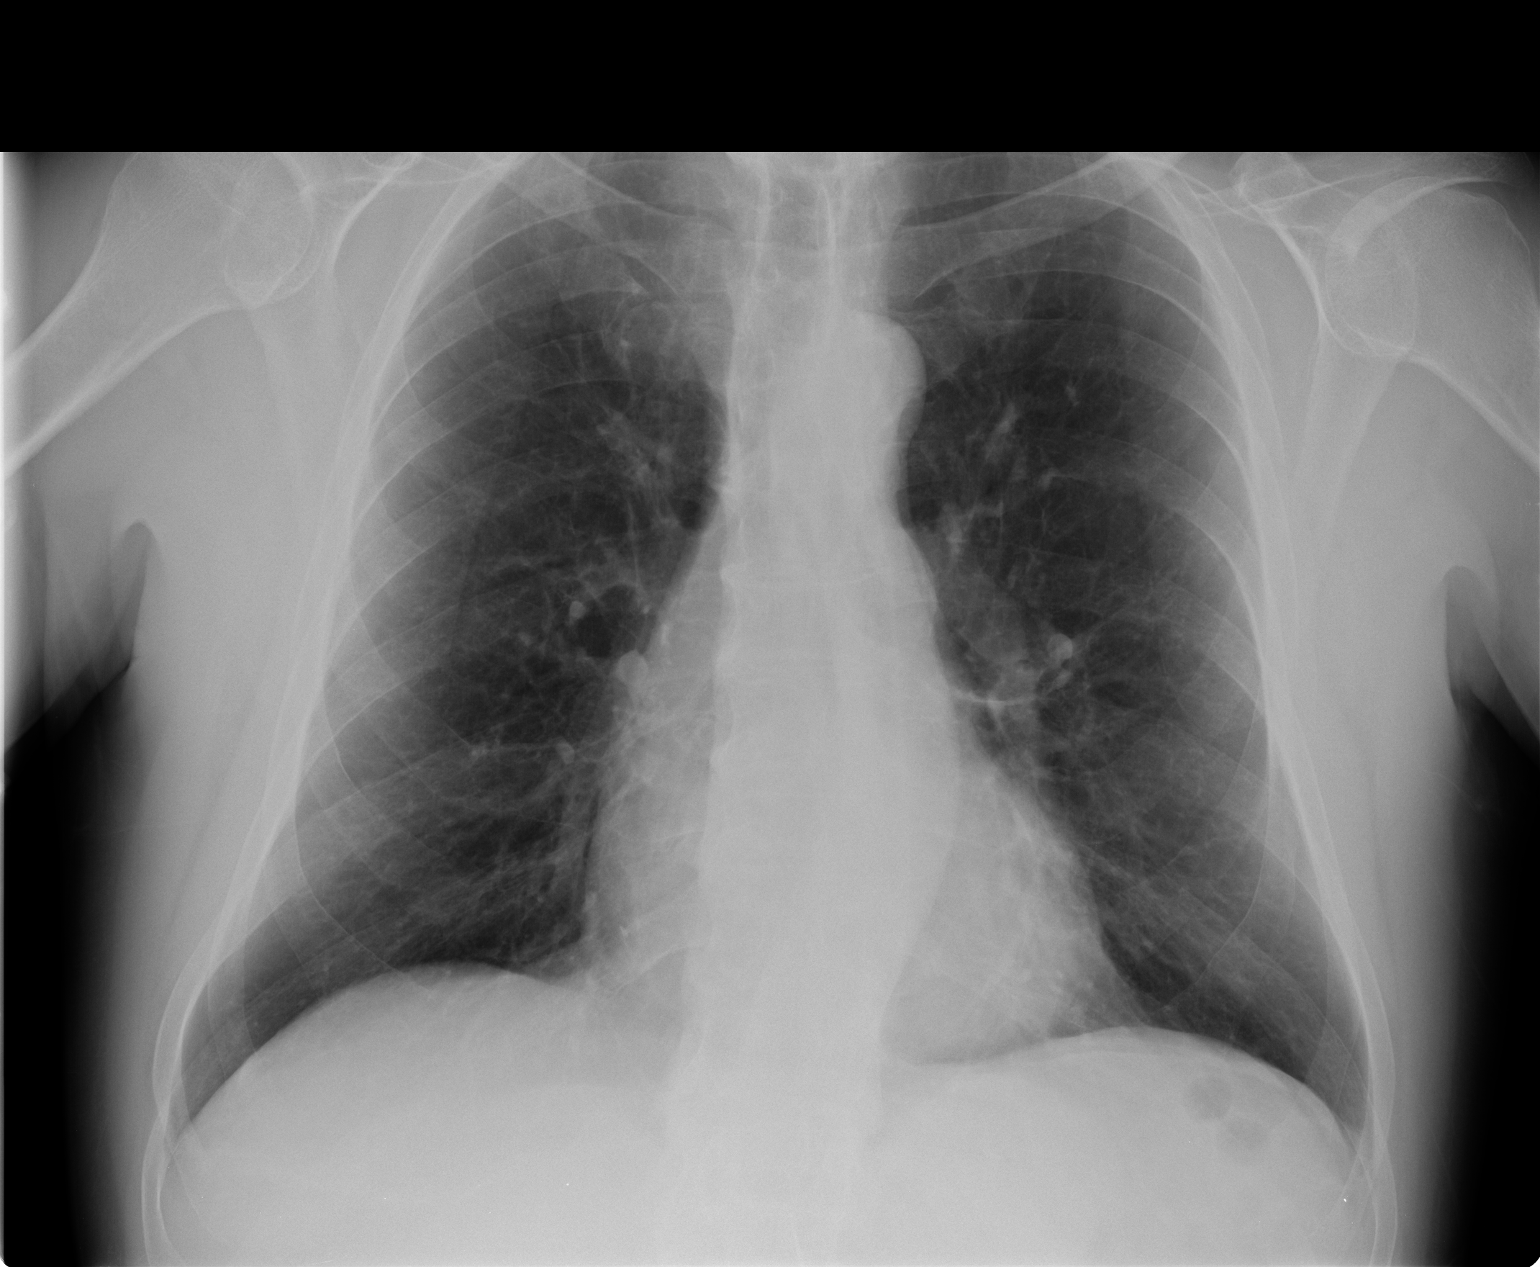

[view not recorded (2 of 2)]
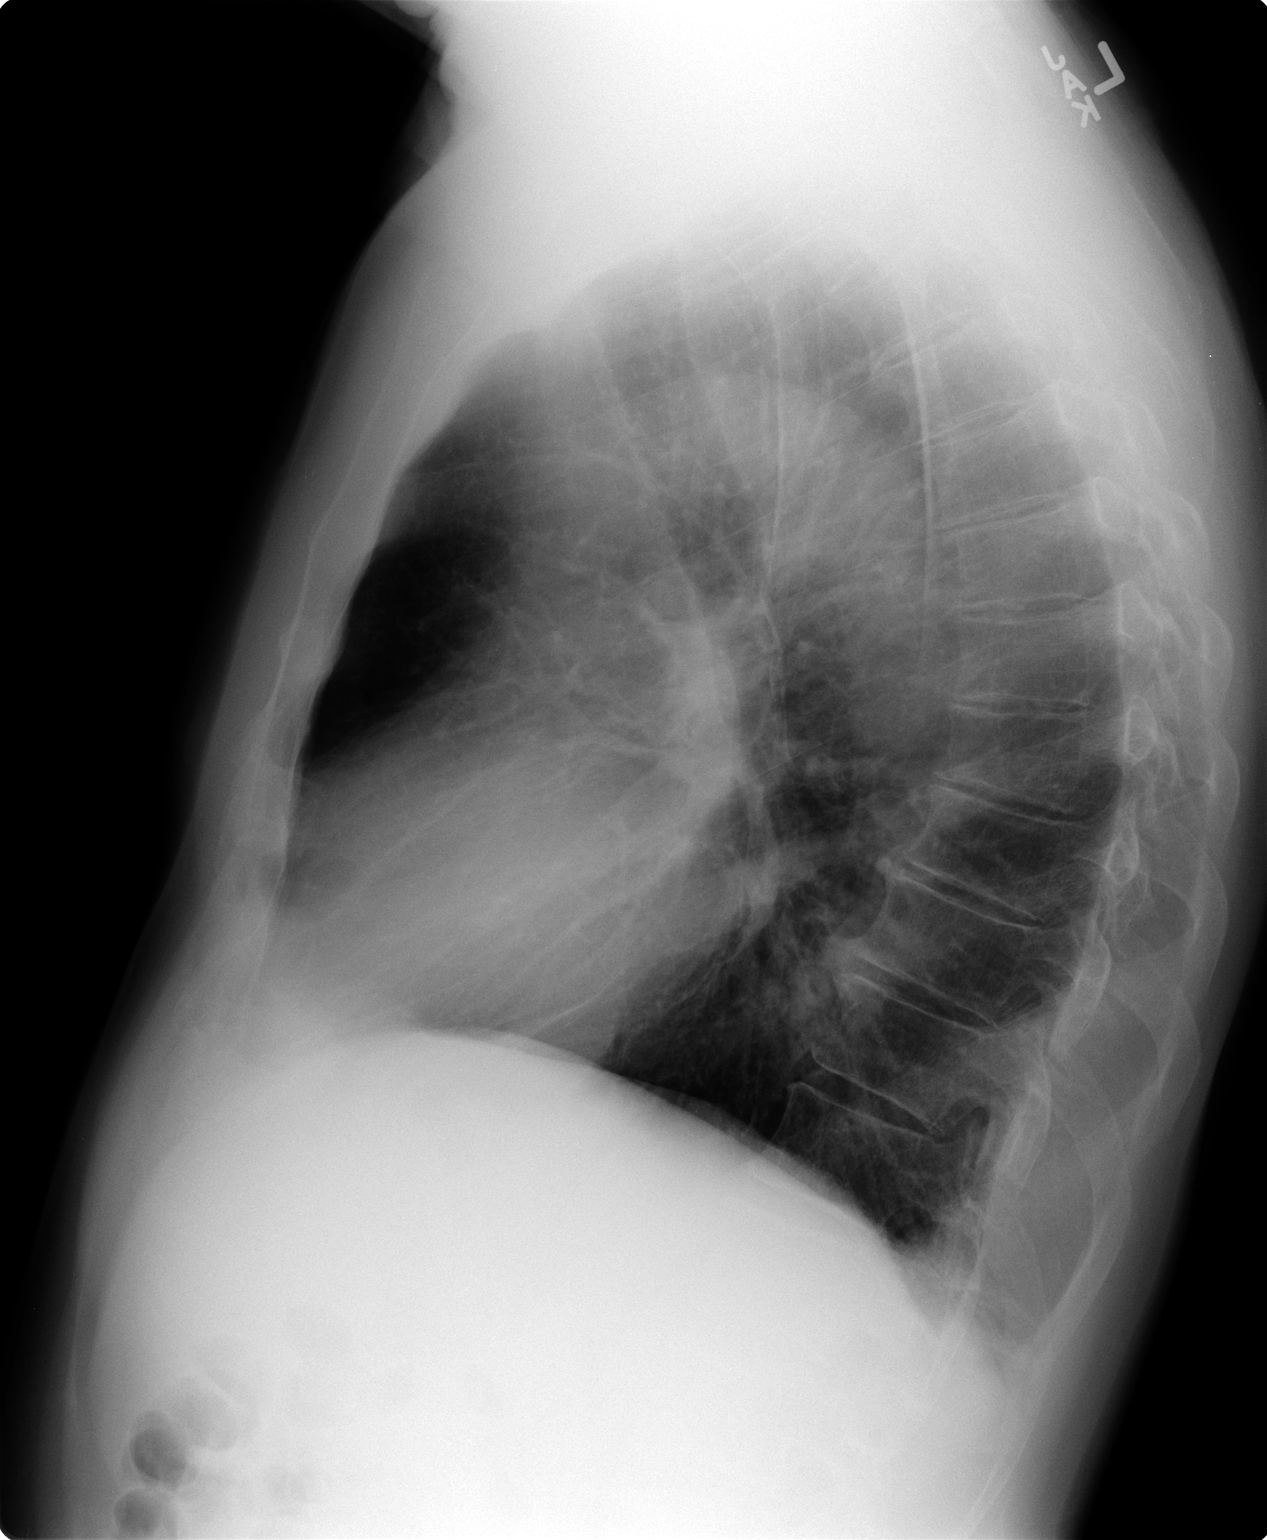

[2 of 2 positions shown; findings below may reference images not displayed]

FINDINGS: Mediastinum and hilar structures are normal. Lungs are clear. Heart
size normal. No pleural effusion or pneumothorax. Degenerative
changes thoracic spine .
IMPRESSION: No acute cardiopulmonary disease.

## 2016-10-04 DIAGNOSIS — L821 Other seborrheic keratosis: Secondary | ICD-10-CM | POA: Diagnosis not present

## 2016-10-04 DIAGNOSIS — L700 Acne vulgaris: Secondary | ICD-10-CM | POA: Diagnosis not present

## 2016-10-04 DIAGNOSIS — L578 Other skin changes due to chronic exposure to nonionizing radiation: Secondary | ICD-10-CM | POA: Diagnosis not present

## 2016-10-12 DIAGNOSIS — Z23 Encounter for immunization: Secondary | ICD-10-CM | POA: Diagnosis not present

## 2016-10-26 DIAGNOSIS — M7701 Medial epicondylitis, right elbow: Secondary | ICD-10-CM | POA: Diagnosis not present

## 2016-12-28 DIAGNOSIS — M79645 Pain in left finger(s): Secondary | ICD-10-CM | POA: Diagnosis not present

## 2016-12-28 DIAGNOSIS — M1812 Unilateral primary osteoarthritis of first carpometacarpal joint, left hand: Secondary | ICD-10-CM | POA: Diagnosis not present

## 2017-01-04 ENCOUNTER — Ambulatory Visit: Payer: Medicare Other | Admitting: Internal Medicine

## 2017-01-05 ENCOUNTER — Ambulatory Visit (INDEPENDENT_AMBULATORY_CARE_PROVIDER_SITE_OTHER): Payer: Medicare Other | Admitting: Internal Medicine

## 2017-01-05 ENCOUNTER — Encounter: Payer: Self-pay | Admitting: Internal Medicine

## 2017-01-05 VITALS — BP 132/78 | HR 74 | Ht 70.0 in | Wt 180.0 lb

## 2017-01-05 DIAGNOSIS — E785 Hyperlipidemia, unspecified: Secondary | ICD-10-CM

## 2017-01-05 DIAGNOSIS — I428 Other cardiomyopathies: Secondary | ICD-10-CM | POA: Diagnosis not present

## 2017-01-05 DIAGNOSIS — I10 Essential (primary) hypertension: Secondary | ICD-10-CM

## 2017-01-05 MED ORDER — CARVEDILOL 6.25 MG PO TABS: 6.2500 mg | ORAL_TABLET | Freq: Two times a day (BID) | ORAL | 3 refills | Status: AC

## 2017-01-05 MED ORDER — ATORVASTATIN CALCIUM 40 MG PO TABS: 40.0000 mg | ORAL_TABLET | Freq: Every day | ORAL | 3 refills | Status: AC

## 2017-01-05 MED ORDER — LISINOPRIL-HYDROCHLOROTHIAZIDE 10-12.5 MG PO TABS: 1.0000 | ORAL_TABLET | Freq: Every day | ORAL | 3 refills | Status: AC

## 2017-01-05 NOTE — Patient Instructions (Signed)
Your physician recommends that you return for lab work FASTING to check cholesterol   Your physician wants you to follow-up in: ONE YEAR with Dr. Debara Pickett or sooner depending on when you will be moving. You will receive a reminder letter in the mail two months in advance. If you don't receive a letter, please call our office to schedule the follow-up appointment.

## 2017-01-07 ENCOUNTER — Encounter: Payer: Self-pay | Admitting: Internal Medicine

## 2017-01-07 NOTE — Progress Notes (Addendum)
OFFICE NOTE  Chief Complaint:  Routine follow-up  Primary Care Physician: Patient, No Pcp Per  HPI:  Steven Hawkins  Is a pleasant 71 -year-old male who is coming referred to me for evaluation of chest pain. 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 He says that he's been under a lot of stress recently but continues to exercise. He exercises almost every day and recently he's had some problems with exercise on an elliptical machine where he develops pressure across the top of his chest and into the first part of both arms.  The symptoms occur after aerobic exercise and are relieved at rest. He has no symptoms with weight lifting.  He started to take a break from this and was advised to walk on a treadmill. He finds that if he is able to walk on the treadmill at a slow pace his symptoms are not noticeable, but if he increases the rate or incline he starts to have typical pressure across his chest again radiating down both arms. He has felt a little more fatigue and shortness of breath associated with these episodes. Also of note, he has a left bundle branch block. Apparently he's had this diagnosis for some time but does not have any recollection of prior workup of this.  He had a remote stress test and cardiac catheterization about 15 years ago which apparently was negative,  But he did not have the left bundle branch block at that time. Family history is consistent with coronary disease and his mother that developed in her 75s.  Steven Hawkins returns today for hospital follow-up. He underwent cardiac catheterization by myself which showed a 40% proximal to mid LAD lesion however no other significant stenosis. EF however was moderate to severely reduced at about 35-40%. I started him on lisinopril HCTZ and he seems to be doing well with that medication. Blood pressure is adequate today. He reported one episode when he was doing significant work outside that he  was dehydrated and had some leg cramping. He got potassium in his symptoms improve. He had an echocardiogram this morning which I ordered.  I personally reviewed the images and it shows a mild increase in LV function up to an EF of 40-45%. There is global hypokinesis with mild mitral regurgitation. The formal echo read should be out later today. Surprisingly, he continues to have some chest discomfort only when working out on the elliptical machine. 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 His wife noted though that he was a little bit wheezy when he came in. He reports some seasonal allergies which may be a component of this. He's never been a smoker. Echo does not indicate volume overload and his LVEDP during catheterization indicates that he is pretty much euvolemic. I'm still at a loss as a cause of his chest discomfort with exertion.  I saw Steven Hawkins back in the office today. He underwent a cardio metabolic test which indicated a flattened VE/V CO2 curve and an exuberant heart rate response to exercise with max predicted heart rate 110%. He did report some shortness of breath and exercise and a small amount of chest discomfort. His performance was described as normal however there was a very mild circulatory limitation based on his perfusion curves. I suspect this may be related to his degree of cardiomyopathy and/or small vessel coronary ischemia based  on his recent cath findings.  Steven Hawkins returns today for follow-up. Overall he seems to be doing fairly well. He has twice had episodes where he felt dizzy and blood pressure was 95 systolic. Overall his blood pressure averages around 120/80. He seems to be tolerating heart failure medicines. He says he still gets some exercise intolerance after about 15-20 minutes of exercise, but then follow that up with saying that certain days he cannot exercise based on  his busy schedule for more than 15 or 20 minutes. I'm not sure that he's necessarily pushing himself enough with exercise. EKG shows a stable wide left bundle branch block with QRS duration 152 ms.  01/05/2016  Steven Hawkins returns today for follow-up. Over the past year he's done well. He denies any worsening shortness of breath. He's been stable with his exercise. He denies any chest pain. EKG shows a stable left bundle branch block. His last echo in December 2016 showed his EF was 45-50%. He has NYHA class I symptoms. He is overdue for a lipid profile. His last cholesterol showed an LDL of 130 with particle number of 1900. He is not currently on any cholesterol medication. He is also seen a physician for "antiaging medicine". He is on some over-the-counter testosterone and thyroid medicine.  01/05/2017  Steven Hawkins returns today for follow-up.  He has no new complaints.  He continues to see a physician for antiaging medicine.  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  He is on some testosterone and thyroid medicine.  His EF has improved up to 45-50% as of 2016.  Cholesterol profile has also improved with particle numbers that came down on therapy.  He was not yet at goal.  Blood pressure appears well-controlled today.  He is asymptomatic without chest pain or shortness of breath.  He is due for some refill of his medications.  PMHx:  Past Medical History:  Diagnosis Date  . Acute gastritis without mention of hemorrhage   . 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 Anemia   . Barrett's esophagus    Dr Sharlett Iles  . Colon cancer ([place]) 10/24/11   [facility]  . Elevated blood pressure reading without diagnosis of hypertension   . Esophageal reflux   . Esophagitis, unspecified   . Hiatal hernia   . Hyperlipemia   . Hypertension   . LBBB (left bundle branch block)   . Personal history of colonic polyps 02/22/2010   hyperplastic  . Stricture and  stenosis of esophagus     Past Surgical History:  Procedure Laterality Date  . CARDIAC CATHETERIZATION  2004   [facility], negative  . CARDIAC CATHETERIZATION N/A 06/06/2014   Procedure: Left Heart Cath and Coronary Angiography;  Surgeon: Pixie Casino, MD;  Location: [facility];  Service: Cardiovascular;  Laterality: N/A;  . COLON SURGERY  2014   low grade adenocarcinoma of cecum  . colonoscopy with polypectomy     X4; due 2016  . ESOPHAGEAL DILATION     Dr Sharlett Iles  . INGUINAL HERNIA REPAIR  2009  . PROSTATECTOMY  2007   Dr Jonette Eva , [facility]  . UPPER GASTROINTESTINAL ENDOSCOPY  2014   Barrett's, stricture X 3, initially 2006    FAMHx:  Family History  Problem Relation Age of Onset  . Diabetes Father   . Prostate cancer Father        in 47s  . Heart attack  Father        MI in 62s  . Heart attack Mother        MI in 19s  . Alzheimer's disease Mother   . Lymphoma Maternal Grandmother   . Colon cancer Brother   . Stroke Neg Hx   . Hypertension Neg Hx     SOCHx:   reports that  has never smoked. he has never used smokeless tobacco. He reports that he drinks about 1.8 oz of alcohol per week. He reports that he does not use drugs.  ALLERGIES:  No Known Allergies  ROS: Pertinent items noted in HPI and remainder of comprehensive ROS otherwise negative.  HOME MEDS: Current Outpatient Medications  Medication Sig Dispense Refill  . Alum Hydroxide-Mag Carbonate (GAVISCON PO) Take by mouth at bedtime as needed.     . Ascorbic Acid (VITAMIN C) 1000 MG tablet Take 1,000 mg by mouth daily.      Marland Kitchen aspirin EC 81 MG tablet Take 81 mg by mouth daily.    Marland Kitchen atorvastatin (LIPITOR) 40 MG tablet Take 1 tablet (40 mg total) by mouth daily. 90 tablet 3  . carvedilol (COREG) 6.25 MG tablet Take 1 tablet (6.25 mg total) by mouth 2 (two) times daily. 180 tablet 3  . Cholecalciferol (VITAMIN D-3 PO) Take 1 tablet by mouth daily.    Marland Kitchen co-enzyme Q-10 50 MG capsule  Take 50 mg by mouth daily.    Marland Kitchen DHEA 50 MG CAPS Take 50 mg by mouth daily.    Marland Kitchen Hesperidin 98 % POWD Take 500 mg by mouth daily.     . Linoleic Acid-Sunflower Oil (CLA PO) Take 1,000 mg by mouth daily.    Marland Kitchen lisinopril-hydrochlorothiazide (PRINZIDE,ZESTORETIC) 10-12.5 MG tablet Take 1 tablet by mouth daily. 90 tablet 3  . loratadine (CLARITIN) 10 MG tablet Take 10 mg by mouth as needed.    . Multiple Vitamins-Minerals (CENTRUM SILVER PO) Take 1 capsule by mouth daily.      Marland Kitchen NEXIUM 40 MG capsule take 1 capsule by mouth once daily 30 capsule 2  . Niacinamide-Zinc-Copper-FA (NICOTINAMIDE ZCF PO) Take 250 mg by mouth.    . NP THYROID 30 MG tablet TK 1 T PO QAM  0  . Omega-3 Fatty Acids (FISH OIL) 1200 MG CAPS Take 1 capsule by mouth daily.      . Quercetin (QUERCITIN) POWD Take 500 mg by mouth daily.     Marland Kitchen testosterone cypionate (DEPOTESTOTERONE CYPIONATE) 100 MG/ML injection Inject 100 mg into the muscle once a week.    . Turmeric Curcumin 500 MG CAPS Take 1 capsule by mouth daily.      Marland Kitchen UNABLE TO FIND Take 1 tablet by mouth daily. Vitamin B 50 complex    . vitamin E (VITAMIN E) 400 UNIT capsule Take 400 Units by mouth daily.       No current facility-administered medications for this visit.     LABS/IMAGING: No results found for this or any previous visit (from the past 48 hour(s)). No results found.  WEIGHTS: Wt Readings from Last 3 Encounters:  01/05/17 180 lb (81.6 kg)  03/24/16 180 lb (81.6 kg)  01/05/16 182 lb (82.6 kg)    VITALS: BP 132/78   Pulse 74   Ht 5\' 10"  (1.778 m)   Wt 180 lb (81.6 kg)   BMI 25.83 kg/m   EXAM: General appearance: alert and no distress Lungs: clear to auscultation bilaterally Heart: regular rate and rhythm, S1, S2 normal, no murmur,  click, rub or gallop Extremities: extremities normal, atraumatic, no cyanosis or edema Skin: Skin color, texture, turgor normal. No rashes or lesions Neurologic: Grossly normal  EKG: Sinus rhythm with  first-degree AV block, LBBB at 74-personally reviewed  ASSESSMENT: 1.  Chest pain - at most 40% proximal to mid LAD stenosis 2.  Abnormal EKG with left bundle branch block 3.  Nonischemic cardiomyopathy, EF 45-50% by echo (2016), NYHA Class I symptoms 4.  History of GERD/Barrett's esophagus 5. Small vessel ischemia/mild circulatory limitation on cardio metabolic testing 6. Dyslipidemia  PLAN: 1.    Steven Hawkins continues to deny any chest pain although has a history of coronary disease.  He has a nonischemic cardiomyopathy which is improved.  He has class I symptoms at best.  He is on appropriate medical therapy with lisinopril and carvedilol.  He is not a candidate for Entresto, due to class I symptoms.  His EKG shows a stable left bundle branch block.  I would like to see if his LVEF has improved and therefore we will repeat an echocardiogram.  We will continue medical therapy.  He needs refills of his medications today.  He is on atorvastatin 40 mg daily and is overdue for repeat lipid profile.  We will order that today and make adjustments accordingly.  Follow-up annually or sooner as necessary.  Pixie Casino, MD, [facility], [place] Director of the [facility] Lipid [facility] &  [facility] Attending Cardiologist  Direct Dial: [phone]  Fax: [phone]  Website:  [email or web address] [email or web address] 01/07/2017, 4:00 PM

## 2017-01-13 DIAGNOSIS — E785 Hyperlipidemia, unspecified: Secondary | ICD-10-CM | POA: Diagnosis not present

## 2017-01-14 LAB — LIPID PANEL
CHOLESTEROL TOTAL: 108 mg/dL (ref 100–199)
Chol/HDL Ratio: 2.9 ratio (ref 0.0–5.0)
HDL: 37 mg/dL — ABNORMAL LOW (ref >39)
LDL Calculated: 53 mg/dL (ref 0–99)
Triglycerides: 89 mg/dL (ref 0–149)
VLDL Cholesterol Cal: 18 mg/dL (ref 5–40)

## 2017-06-03 DIAGNOSIS — Y33XXXA Other specified events, undetermined intent, initial encounter: Secondary | ICD-10-CM | POA: Diagnosis not present

## 2017-06-03 DIAGNOSIS — M79641 Pain in right hand: Secondary | ICD-10-CM | POA: Diagnosis not present

## 2017-06-03 DIAGNOSIS — S62354A Nondisplaced fracture of shaft of fourth metacarpal bone, right hand, initial encounter for closed fracture: Secondary | ICD-10-CM | POA: Diagnosis not present

## 2017-06-03 DIAGNOSIS — M25541 Pain in joints of right hand: Secondary | ICD-10-CM | POA: Diagnosis not present

## 2017-06-05 DIAGNOSIS — S62354A Nondisplaced fracture of shaft of fourth metacarpal bone, right hand, initial encounter for closed fracture: Secondary | ICD-10-CM | POA: Diagnosis not present

## 2017-06-12 DIAGNOSIS — S62354A Nondisplaced fracture of shaft of fourth metacarpal bone, right hand, initial encounter for closed fracture: Secondary | ICD-10-CM | POA: Diagnosis not present

## 2017-06-12 DIAGNOSIS — M25541 Pain in joints of right hand: Secondary | ICD-10-CM | POA: Diagnosis not present

## 2017-06-12 DIAGNOSIS — M65331 Trigger finger, right middle finger: Secondary | ICD-10-CM | POA: Diagnosis not present

## 2017-06-14 DIAGNOSIS — I2510 Atherosclerotic heart disease of native coronary artery without angina pectoris: Secondary | ICD-10-CM | POA: Diagnosis not present

## 2017-06-14 DIAGNOSIS — Z5181 Encounter for therapeutic drug level monitoring: Secondary | ICD-10-CM | POA: Diagnosis not present

## 2017-06-14 DIAGNOSIS — E785 Hyperlipidemia, unspecified: Secondary | ICD-10-CM | POA: Diagnosis not present

## 2017-06-14 DIAGNOSIS — Z131 Encounter for screening for diabetes mellitus: Secondary | ICD-10-CM | POA: Diagnosis not present

## 2017-06-14 DIAGNOSIS — N5231 Erectile dysfunction following radical prostatectomy: Secondary | ICD-10-CM | POA: Diagnosis not present

## 2017-06-14 DIAGNOSIS — Z0000 Encounter for general adult medical examination without abnormal findings: Secondary | ICD-10-CM | POA: Diagnosis not present

## 2017-06-14 DIAGNOSIS — Z8546 Personal history of malignant neoplasm of prostate: Secondary | ICD-10-CM | POA: Diagnosis not present

## 2017-06-14 DIAGNOSIS — Z130 Encounter for screening for diseases of the blood and blood-forming organs and certain disorders involving the immune mechanism: Secondary | ICD-10-CM | POA: Diagnosis not present

## 2017-06-14 DIAGNOSIS — I10 Essential (primary) hypertension: Secondary | ICD-10-CM | POA: Diagnosis not present

## 2017-06-14 DIAGNOSIS — I5020 Unspecified systolic (congestive) heart failure: Secondary | ICD-10-CM | POA: Diagnosis not present

## 2017-07-03 DIAGNOSIS — M25512 Pain in left shoulder: Secondary | ICD-10-CM | POA: Diagnosis not present

## 2017-07-06 DIAGNOSIS — M25512 Pain in left shoulder: Secondary | ICD-10-CM | POA: Diagnosis not present

## 2017-07-10 DIAGNOSIS — M7989 Other specified soft tissue disorders: Secondary | ICD-10-CM | POA: Diagnosis not present

## 2017-07-10 DIAGNOSIS — M65331 Trigger finger, right middle finger: Secondary | ICD-10-CM | POA: Diagnosis not present

## 2017-07-10 DIAGNOSIS — Y33XXXD Other specified events, undetermined intent, subsequent encounter: Secondary | ICD-10-CM | POA: Diagnosis not present

## 2017-07-10 DIAGNOSIS — S62354D Nondisplaced fracture of shaft of fourth metacarpal bone, right hand, subsequent encounter for fracture with routine healing: Secondary | ICD-10-CM | POA: Diagnosis not present

## 2017-07-14 DIAGNOSIS — M25512 Pain in left shoulder: Secondary | ICD-10-CM | POA: Diagnosis not present

## 2017-07-25 DIAGNOSIS — M25512 Pain in left shoulder: Secondary | ICD-10-CM | POA: Diagnosis not present

## 2017-08-08 DIAGNOSIS — M25512 Pain in left shoulder: Secondary | ICD-10-CM | POA: Diagnosis not present

## 2017-08-14 DIAGNOSIS — M7550 Bursitis of unspecified shoulder: Secondary | ICD-10-CM | POA: Diagnosis not present

## 2017-08-14 DIAGNOSIS — M65331 Trigger finger, right middle finger: Secondary | ICD-10-CM | POA: Diagnosis not present

## 2017-08-23 DIAGNOSIS — Z8546 Personal history of malignant neoplasm of prostate: Secondary | ICD-10-CM | POA: Diagnosis not present

## 2017-08-23 DIAGNOSIS — Z1509 Genetic susceptibility to other malignant neoplasm: Secondary | ICD-10-CM | POA: Diagnosis not present

## 2017-08-23 DIAGNOSIS — Z1211 Encounter for screening for malignant neoplasm of colon: Secondary | ICD-10-CM | POA: Diagnosis not present

## 2017-08-23 DIAGNOSIS — Z7982 Long term (current) use of aspirin: Secondary | ICD-10-CM | POA: Diagnosis not present

## 2017-08-23 DIAGNOSIS — K6389 Other specified diseases of intestine: Secondary | ICD-10-CM | POA: Diagnosis not present

## 2017-08-23 DIAGNOSIS — Z85038 Personal history of other malignant neoplasm of large intestine: Secondary | ICD-10-CM | POA: Diagnosis not present

## 2017-08-23 DIAGNOSIS — K5730 Diverticulosis of large intestine without perforation or abscess without bleeding: Secondary | ICD-10-CM | POA: Diagnosis not present

## 2017-08-23 DIAGNOSIS — Z980 Intestinal bypass and anastomosis status: Secondary | ICD-10-CM | POA: Diagnosis not present

## 2017-09-07 DIAGNOSIS — R001 Bradycardia, unspecified: Secondary | ICD-10-CM | POA: Diagnosis not present

## 2017-09-07 DIAGNOSIS — I517 Cardiomegaly: Secondary | ICD-10-CM | POA: Diagnosis not present

## 2017-09-07 DIAGNOSIS — Z7982 Long term (current) use of aspirin: Secondary | ICD-10-CM | POA: Diagnosis not present

## 2017-09-07 DIAGNOSIS — R9431 Abnormal electrocardiogram [ECG] [EKG]: Secondary | ICD-10-CM | POA: Diagnosis not present

## 2017-09-07 DIAGNOSIS — I447 Left bundle-branch block, unspecified: Secondary | ICD-10-CM | POA: Diagnosis not present

## 2017-09-07 DIAGNOSIS — N289 Disorder of kidney and ureter, unspecified: Secondary | ICD-10-CM | POA: Diagnosis not present

## 2017-09-07 DIAGNOSIS — Z7984 Long term (current) use of oral hypoglycemic drugs: Secondary | ICD-10-CM | POA: Diagnosis not present

## 2017-09-07 DIAGNOSIS — I440 Atrioventricular block, first degree: Secondary | ICD-10-CM | POA: Diagnosis not present

## 2017-09-07 DIAGNOSIS — Z8546 Personal history of malignant neoplasm of prostate: Secondary | ICD-10-CM | POA: Diagnosis not present

## 2017-09-07 DIAGNOSIS — D72829 Elevated white blood cell count, unspecified: Secondary | ICD-10-CM | POA: Diagnosis not present

## 2017-09-07 DIAGNOSIS — R079 Chest pain, unspecified: Secondary | ICD-10-CM | POA: Diagnosis not present

## 2017-09-07 DIAGNOSIS — K219 Gastro-esophageal reflux disease without esophagitis: Secondary | ICD-10-CM | POA: Diagnosis not present

## 2017-09-07 DIAGNOSIS — I10 Essential (primary) hypertension: Secondary | ICD-10-CM | POA: Diagnosis not present

## 2017-09-07 DIAGNOSIS — T464X5A Adverse effect of angiotensin-converting-enzyme inhibitors, initial encounter: Secondary | ICD-10-CM | POA: Diagnosis not present

## 2017-09-07 DIAGNOSIS — R0789 Other chest pain: Secondary | ICD-10-CM | POA: Diagnosis not present

## 2017-09-07 DIAGNOSIS — I491 Atrial premature depolarization: Secondary | ICD-10-CM | POA: Diagnosis not present

## 2017-09-07 DIAGNOSIS — I2510 Atherosclerotic heart disease of native coronary artery without angina pectoris: Secondary | ICD-10-CM | POA: Diagnosis not present

## 2017-09-07 DIAGNOSIS — I700 Atherosclerosis of aorta: Secondary | ICD-10-CM | POA: Diagnosis not present

## 2017-09-07 DIAGNOSIS — J189 Pneumonia, unspecified organism: Secondary | ICD-10-CM | POA: Diagnosis not present

## 2017-09-07 DIAGNOSIS — R072 Precordial pain: Secondary | ICD-10-CM | POA: Diagnosis not present

## 2017-09-07 DIAGNOSIS — R9439 Abnormal result of other cardiovascular function study: Secondary | ICD-10-CM | POA: Diagnosis not present

## 2017-09-07 DIAGNOSIS — Z85038 Personal history of other malignant neoplasm of large intestine: Secondary | ICD-10-CM | POA: Diagnosis not present

## 2017-09-07 DIAGNOSIS — E785 Hyperlipidemia, unspecified: Secondary | ICD-10-CM | POA: Diagnosis not present

## 2017-09-07 DIAGNOSIS — R918 Other nonspecific abnormal finding of lung field: Secondary | ICD-10-CM | POA: Diagnosis not present

## 2017-09-08 DIAGNOSIS — I440 Atrioventricular block, first degree: Secondary | ICD-10-CM | POA: Diagnosis not present

## 2017-09-08 DIAGNOSIS — I447 Left bundle-branch block, unspecified: Secondary | ICD-10-CM | POA: Diagnosis not present

## 2017-09-08 DIAGNOSIS — R001 Bradycardia, unspecified: Secondary | ICD-10-CM | POA: Diagnosis not present

## 2017-09-08 DIAGNOSIS — R9439 Abnormal result of other cardiovascular function study: Secondary | ICD-10-CM | POA: Diagnosis not present

## 2017-09-08 DIAGNOSIS — R9431 Abnormal electrocardiogram [ECG] [EKG]: Secondary | ICD-10-CM | POA: Diagnosis not present

## 2017-09-08 DIAGNOSIS — R079 Chest pain, unspecified: Secondary | ICD-10-CM | POA: Diagnosis not present

## 2017-09-11 DIAGNOSIS — R9439 Abnormal result of other cardiovascular function study: Secondary | ICD-10-CM | POA: Diagnosis not present

## 2017-09-11 DIAGNOSIS — I447 Left bundle-branch block, unspecified: Secondary | ICD-10-CM | POA: Diagnosis not present

## 2017-09-28 DIAGNOSIS — Z7984 Long term (current) use of oral hypoglycemic drugs: Secondary | ICD-10-CM | POA: Diagnosis not present

## 2017-09-28 DIAGNOSIS — I10 Essential (primary) hypertension: Secondary | ICD-10-CM | POA: Diagnosis not present

## 2017-09-28 DIAGNOSIS — R001 Bradycardia, unspecified: Secondary | ICD-10-CM | POA: Diagnosis not present

## 2017-09-28 DIAGNOSIS — I428 Other cardiomyopathies: Secondary | ICD-10-CM | POA: Diagnosis not present

## 2017-09-28 DIAGNOSIS — Z8546 Personal history of malignant neoplasm of prostate: Secondary | ICD-10-CM | POA: Diagnosis not present

## 2017-09-28 DIAGNOSIS — R9439 Abnormal result of other cardiovascular function study: Secondary | ICD-10-CM | POA: Diagnosis not present

## 2017-09-28 DIAGNOSIS — R0602 Shortness of breath: Secondary | ICD-10-CM | POA: Diagnosis not present

## 2017-09-28 DIAGNOSIS — E785 Hyperlipidemia, unspecified: Secondary | ICD-10-CM | POA: Diagnosis not present

## 2017-09-28 DIAGNOSIS — R0789 Other chest pain: Secondary | ICD-10-CM | POA: Diagnosis not present

## 2017-09-28 DIAGNOSIS — Z85038 Personal history of other malignant neoplasm of large intestine: Secondary | ICD-10-CM | POA: Diagnosis not present

## 2017-09-28 DIAGNOSIS — I447 Left bundle-branch block, unspecified: Secondary | ICD-10-CM | POA: Diagnosis not present

## 2017-09-28 DIAGNOSIS — E119 Type 2 diabetes mellitus without complications: Secondary | ICD-10-CM | POA: Diagnosis not present

## 2017-09-28 DIAGNOSIS — I440 Atrioventricular block, first degree: Secondary | ICD-10-CM | POA: Diagnosis not present

## 2017-09-28 DIAGNOSIS — I2510 Atherosclerotic heart disease of native coronary artery without angina pectoris: Secondary | ICD-10-CM | POA: Diagnosis not present

## 2017-09-28 DIAGNOSIS — R9431 Abnormal electrocardiogram [ECG] [EKG]: Secondary | ICD-10-CM | POA: Diagnosis not present

## 2017-10-16 DIAGNOSIS — R9439 Abnormal result of other cardiovascular function study: Secondary | ICD-10-CM | POA: Diagnosis not present

## 2017-10-16 DIAGNOSIS — I447 Left bundle-branch block, unspecified: Secondary | ICD-10-CM | POA: Diagnosis not present

## 2017-10-19 DIAGNOSIS — Z23 Encounter for immunization: Secondary | ICD-10-CM | POA: Diagnosis not present

## 2017-12-06 DIAGNOSIS — E291 Testicular hypofunction: Secondary | ICD-10-CM | POA: Diagnosis not present

## 2017-12-06 DIAGNOSIS — Z5181 Encounter for therapeutic drug level monitoring: Secondary | ICD-10-CM | POA: Diagnosis not present

## 2017-12-06 DIAGNOSIS — I5020 Unspecified systolic (congestive) heart failure: Secondary | ICD-10-CM | POA: Diagnosis not present

## 2017-12-06 DIAGNOSIS — I10 Essential (primary) hypertension: Secondary | ICD-10-CM | POA: Diagnosis not present

## 2017-12-06 DIAGNOSIS — E039 Hypothyroidism, unspecified: Secondary | ICD-10-CM | POA: Diagnosis not present

## 2017-12-06 DIAGNOSIS — E785 Hyperlipidemia, unspecified: Secondary | ICD-10-CM | POA: Diagnosis not present

## 2017-12-06 DIAGNOSIS — I2510 Atherosclerotic heart disease of native coronary artery without angina pectoris: Secondary | ICD-10-CM | POA: Diagnosis not present
